# Patient Record
Sex: Female | Born: 1985 | Race: Black or African American | Hispanic: No | Marital: Single | Smoking: Former smoker
Health system: Southern US, Community
[De-identification: ages and names within clinical notes are randomized; demographics above are authoritative.]

## PROBLEM LIST (undated history)

## (undated) DIAGNOSIS — F32A Depression, unspecified: Secondary | ICD-10-CM

## (undated) DIAGNOSIS — E119 Type 2 diabetes mellitus without complications: Secondary | ICD-10-CM

## (undated) DIAGNOSIS — F329 Major depressive disorder, single episode, unspecified: Secondary | ICD-10-CM

## (undated) DIAGNOSIS — O24419 Gestational diabetes mellitus in pregnancy, unspecified control: Secondary | ICD-10-CM

## (undated) DIAGNOSIS — R87629 Unspecified abnormal cytological findings in specimens from vagina: Secondary | ICD-10-CM

## (undated) HISTORY — DX: Unspecified abnormal cytological findings in specimens from vagina: R87.629

## (undated) HISTORY — PX: DILATION AND CURETTAGE OF UTERUS: SHX78

## (undated) HISTORY — PX: NO PAST SURGERIES: SHX2092

## (undated) HISTORY — DX: Gestational diabetes mellitus in pregnancy, unspecified control: O24.419

## (undated) HISTORY — DX: Depression, unspecified: F32.A

---
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
## 2005-08-26 ENCOUNTER — Encounter: Payer: Self-pay | Admitting: Emergency Medicine

## 2006-01-14 ENCOUNTER — Ambulatory Visit (HOSPITAL_COMMUNITY): Admission: RE | Admit: 2006-01-14 | Discharge: 2006-01-14 | Payer: Self-pay | Admitting: Obstetrics & Gynecology

## 2006-01-28 ENCOUNTER — Ambulatory Visit (HOSPITAL_COMMUNITY): Admission: RE | Admit: 2006-01-28 | Discharge: 2006-01-28 | Payer: Self-pay | Admitting: Obstetrics & Gynecology

## 2006-03-17 ENCOUNTER — Ambulatory Visit: Payer: Self-pay | Admitting: Obstetrics and Gynecology

## 2006-03-31 ENCOUNTER — Ambulatory Visit: Payer: Self-pay | Admitting: Obstetrics and Gynecology

## 2006-06-19 ENCOUNTER — Ambulatory Visit: Payer: Self-pay | Admitting: Obstetrics and Gynecology

## 2006-06-19 ENCOUNTER — Inpatient Hospital Stay (HOSPITAL_COMMUNITY): Admission: AD | Admit: 2006-06-19 | Discharge: 2006-06-21 | Payer: Self-pay | Admitting: Obstetrics & Gynecology

## 2006-12-16 ENCOUNTER — Ambulatory Visit: Payer: Self-pay | Admitting: Gynecology

## 2006-12-30 ENCOUNTER — Ambulatory Visit: Payer: Self-pay | Admitting: Family Medicine

## 2006-12-30 ENCOUNTER — Other Ambulatory Visit: Admission: RE | Admit: 2006-12-30 | Discharge: 2006-12-30 | Payer: Self-pay | Admitting: Obstetrics and Gynecology

## 2006-12-30 ENCOUNTER — Encounter: Payer: Self-pay | Admitting: Obstetrics and Gynecology

## 2007-01-27 ENCOUNTER — Ambulatory Visit: Payer: Self-pay | Admitting: Obstetrics and Gynecology

## 2010-09-09 NOTE — Assessment & Plan Note (Signed)
NAME:  Alexis Ali, Alexis Ali                  ACCOUNT NO.:  [id number]   MEDICAL RECORD NO.:  [id number]          PATIENT TYPE:  WOC   LOCATION:  [facility] at [facility]         FACILITY:  [facility]   PHYSICIAN:  Tinnie Gens, MD        DATE OF BIRTH:  1985/11/03   DATE OF SERVICE:                                  CLINIC NOTE   CHIEF COMPLAINT:  Abnormal Pap smear.   HISTORY OF PRESENT ILLNESS:  Patient is a 25 year old gravida 1, para 1  who had a vaginal delivery, who has had abnormal Paps.  Her last Pap was  CIN-II to III and her pathology shows CIN-II to III on colpo.  She was  referred from [facility] for a LEEP procedure.  Patient has already  been seen and consulted by Dr. Mia Creek and watched the video.  She is  here today for the actual procedure.   PHYSICAL EXAMINATION:  VITAL SIGNS:  On exam, her vitals are as noted in  the chart.  GENERAL:  She is a well-developed, well-nourished female in no acute  distress.  GU:  Normal external female genitalia.  BUS is normal.  Vaginal is pink  and rugose.  Cervix is parous without lesion, it is small.   PROCEDURE:  __________  was placed on the cervix.  An area of  acetowhite is noted to extend up into the endocervical canal between 12  and 2 o'clock.  The cervix was then infiltrated with 20 mL of 1%  lidocaine with epinephrine.  LEEP is done with a medium-sized apple  fissure cone.  The bed of the LEEP was then cauterized with  electrocautery.  Monsel's was used to achieve hemostasis.  Patient  tolerated the procedure well.  All instruments were removed from the  vagina.   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   PLAN:  Followup path in four weeks.  Nothing in the vagina for at least  the next two weeks.           ______________________________  Tinnie Gens, MD     TP/MEDQ  D:  12/30/2006  T:  12/30/2006  Job:  [id number]

## 2011-02-06 LAB — POCT PREGNANCY, URINE
Operator id: 120861
Preg Test, Ur: NEGATIVE

## 2018-08-22 ENCOUNTER — Encounter (HOSPITAL_COMMUNITY): Payer: Self-pay

## 2018-08-22 ENCOUNTER — Emergency Department (HOSPITAL_COMMUNITY)
Admission: EM | Admit: 2018-08-22 | Discharge: 2018-08-22 | Disposition: A | Discharge status: Home / Self Care | Payer: Medicaid Other | Attending: Emergency Medicine | Admitting: Emergency Medicine

## 2018-08-22 DIAGNOSIS — Z3201 Encounter for pregnancy test, result positive: Secondary | ICD-10-CM | POA: Diagnosis not present

## 2018-08-22 DIAGNOSIS — R110 Nausea: Secondary | ICD-10-CM | POA: Diagnosis not present

## 2018-08-22 LAB — POC URINE PREG, ED: Preg Test, Ur: POSITIVE — AB

## 2018-08-22 NOTE — ED Provider Notes (Signed)
[facility]   CSN: [id number] Arrival date & time: 08/22/18  2100    History   Chief Complaint Chief Complaint  Patient presents with  . Possible Pregnancy    HPI Alexis Ali is a 33 y.o. female.     Patient to ED to find out if she is pregnant. LMP 07/18/2018. No vaginal bleeding, abdominal/pelvic pain, vaginal discharge, vomiting, fever, or urinary symptoms. She took a home pregnancy test that was positive. No other complaints.  The history is provided by the patient. No language interpreter was used.  Possible Pregnancy  Pertinent negatives include no abdominal pain.    History reviewed. No pertinent past medical history.  There are no active problems to display for this patient.   OB History   No obstetric history on file.      Home Medications    Prior to Admission medications   Not on File    Family History History reviewed. No pertinent family history.  Social History Social History   Tobacco Use  . Smoking status: Not on file  Substance Use Topics  . Alcohol use: Not on file  . Drug use: Not on file     Allergies   Patient has no known allergies.   Review of Systems Review of Systems  Gastrointestinal: Positive for nausea. Negative for abdominal pain and vomiting.  Genitourinary: Positive for menstrual problem. Negative for dysuria, pelvic pain, vaginal bleeding and vaginal discharge.  Musculoskeletal: Negative for back pain.  Neurological: Negative for light-headedness.     Physical Exam Updated Vital Signs BP 122/81   Pulse 64   Temp 98.3 F (36.8 C) (Oral)   Resp 18   Ht 5\' 1"  (1.549 m)   Wt 90.7 kg   LMP 07/18/2018   SpO2 100%   BMI 37.79 kg/m   Physical Exam Constitutional:      Appearance: She is well-developed.  Neck:     Musculoskeletal: Normal range of motion.  Pulmonary:     Effort: Pulmonary effort is normal.  Abdominal:     Palpations: Abdomen is soft.      Tenderness: There is no abdominal tenderness.  Skin:    General: Skin is warm and dry.  Neurological:     Mental Status: She is alert and oriented to person, place, and time.      ED Treatments / Results  Labs (all labs ordered are listed, but only abnormal results are displayed) Labs Reviewed  POC URINE PREG, ED - Abnormal; Notable for the following components:      Result Value   Preg Test, Ur POSITIVE (*)    All other components within normal limits    EKG None  Radiology No results found.  Procedures Procedures (including critical care time)  Medications Ordered in ED Medications - No data to display   Initial Impression / Assessment and Plan / ED Course  I have reviewed the triage vital signs and the nursing notes.  Pertinent labs & imaging results that were available during my care of the patient were reviewed by me and considered in my medical decision making (see chart for details).        Patient to ED to confirm pregnancy. Urine pregnancy test positive. No bleeding or pain. Will refer for prenatal care, start prenatal vitamins.  Final Clinical Impressions(s) / ED Diagnoses   Final diagnoses:  None   1. Pregnancy  ED Discharge Orders    None  Elpidio Anis, PA-C 08/22/18 2215    Gerhard Munch, MD 08/23/18 [phone]

## 2018-08-22 NOTE — ED Triage Notes (Signed)
Pt reports last period last month. Pt reports taking at home test today that was positive. Pt reports some nausea this AM.

## 2018-08-22 NOTE — Discharge Instructions (Addendum)
You should start taking prenatal vitamins which can be found over-the-counter. Take one daily. Make an appointment with an OB of your choice to start prenatal care.

## 2018-09-30 DIAGNOSIS — O0990 Supervision of high risk pregnancy, unspecified, unspecified trimester: Secondary | ICD-10-CM | POA: Insufficient documentation

## 2018-10-03 ENCOUNTER — Other Ambulatory Visit (HOSPITAL_COMMUNITY)
Admission: RE | Admit: 2018-10-03 | Discharge: 2018-10-03 | Disposition: A | Discharge status: Home / Self Care | Payer: Medicaid Other | Source: Ambulatory Visit | Attending: Obstetrics and Gynecology | Admitting: Obstetrics and Gynecology

## 2018-10-03 ENCOUNTER — Encounter: Payer: Self-pay | Admitting: Obstetrics and Gynecology

## 2018-10-03 ENCOUNTER — Other Ambulatory Visit: Payer: Self-pay

## 2018-10-03 ENCOUNTER — Ambulatory Visit (INDEPENDENT_AMBULATORY_CARE_PROVIDER_SITE_OTHER): Payer: Medicaid Other | Admitting: Obstetrics and Gynecology

## 2018-10-03 DIAGNOSIS — Z3491 Encounter for supervision of normal pregnancy, unspecified, first trimester: Secondary | ICD-10-CM

## 2018-10-03 DIAGNOSIS — Z8632 Personal history of gestational diabetes: Secondary | ICD-10-CM

## 2018-10-03 DIAGNOSIS — Z3490 Encounter for supervision of normal pregnancy, unspecified, unspecified trimester: Secondary | ICD-10-CM | POA: Insufficient documentation

## 2018-10-03 DIAGNOSIS — Z3481 Encounter for supervision of other normal pregnancy, first trimester: Secondary | ICD-10-CM | POA: Diagnosis not present

## 2018-10-03 DIAGNOSIS — O24119 Pre-existing diabetes mellitus, type 2, in pregnancy, unspecified trimester: Secondary | ICD-10-CM | POA: Insufficient documentation

## 2018-10-03 DIAGNOSIS — Z3A11 11 weeks gestation of pregnancy: Secondary | ICD-10-CM

## 2018-10-03 HISTORY — DX: Personal history of gestational diabetes: Z86.32

## 2018-10-03 MED ORDER — DOXYLAMINE-PYRIDOXINE 10-10 MG PO TBEC: DELAYED_RELEASE_TABLET | ORAL | 6 refills | Status: DC

## 2018-10-03 MED ORDER — BLOOD PRESSURE MONITOR KIT: 1.0000 | PACK | Freq: Once | 0 refills | Status: DC

## 2018-10-03 MED ORDER — VITAFOL GUMMIES 3.33-0.333-34.8 MG PO CHEW: 3.0000 | CHEWABLE_TABLET | Freq: Every day | ORAL | 11 refills | Status: DC

## 2018-10-03 NOTE — Progress Notes (Signed)
  Subjective:    Alexis Ali is a G2P1001 [redacted]w[redacted]d being seen today for her first obstetrical visit.  Her obstetrical history is significant for obesity and history pf GDMA1. Patient does intend to breast feed. Pregnancy history fully reviewed.  Patient reports vomiting.  Vitals:   10/03/18 1012  BP: 112/73  Pulse: 80  Weight: 234 lb (106.1 kg)    HISTORY: OB History  Gravida Para Term Preterm AB Living  2 1 1     1   SAB TAB Ectopic Multiple Live Births          1    # Outcome Date GA Lbr Len/2nd Weight Sex Delivery Anes PTL Lv  2 Current           1 Term 06/19/06 [redacted]w[redacted]d   M Vag-Spont   LIV   No past medical history on file.  No family history on file.   Exam    Uterus:   12-weeks  Pelvic Exam:    Perineum: No Hemorrhoids, Normal Perineum   Vulva: normal   Vagina:  normal mucosa, normal discharge   pH:    Cervix: multiparous appearance   Adnexa: normal adnexa and no mass, fullness, tenderness   Bony Pelvis: gynecoid  System: Breast:  normal appearance, no masses or tenderness   Skin: normal coloration and turgor, no rashes    Neurologic: oriented, no focal deficits   Extremities: normal strength, tone, and muscle mass   HEENT extra ocular movement intact   Mouth/Teeth mucous membranes moist, pharynx normal without lesions and dental hygiene good   Neck supple and no masses   Cardiovascular: regular rate and rhythm   Respiratory:  appears well, vitals normal, no respiratory distress, acyanotic, normal RR, chest clear, no wheezing, crepitations, rhonchi, normal symmetric air entry   Abdomen: soft, non-tender; bowel sounds normal; no masses,  no organomegaly   Urinary:       Assessment:    Pregnancy: G2P1001 Patient Active Problem List   Diagnosis Date Noted  . Supervision of normal pregnancy, antepartum 09/30/2018        Plan:     Initial labs drawn. Prenatal vitamins. Problem list reviewed and updated. Genetic Screening discussed : panorama  ordered.  Ultrasound discussed; fetal survey: requested.  Follow up in 4 weeks. 50% of 30 min visit spent on counseling and coordination of care.     Ricquel Foulk 10/03/2018

## 2018-10-03 NOTE — Patient Instructions (Signed)
° °First Trimester of Pregnancy °The first trimester of pregnancy is from week 1 until the end of week 13 (months 1 through 3). A week after a sperm fertilizes an egg, the egg will implant on the wall of the uterus. This embryo will begin to develop into a baby. Genes from you and your partner will form the baby. The female genes will determine whether the baby will be a boy or a girl. At 6-8 weeks, the eyes and face will be formed, and the heartbeat can be seen on ultrasound. At the end of 12 weeks, all the baby's organs will be formed. °Now that you are pregnant, you will want to do everything you can to have a healthy baby. Two of the most important things are to get good prenatal care and to follow your health care provider's instructions. Prenatal care is all the medical care you receive before the baby's birth. This care will help prevent, find, and treat any problems during the pregnancy and childbirth. °Body changes during your first trimester °Your body goes through many changes during pregnancy. The changes vary from woman to woman. °· You may gain or lose a couple of pounds at first. °· You may feel sick to your stomach (nauseous) and you may throw up (vomit). If the vomiting is uncontrollable, call your health care provider. °· You may tire easily. °· You may develop headaches that can be relieved by medicines. All medicines should be approved by your health care provider. °· You may urinate more often. Painful urination may mean you have a bladder infection. °· You may develop heartburn as a result of your pregnancy. °· You may develop constipation because certain hormones are causing the muscles that push stool through your intestines to slow down. °· You may develop hemorrhoids or swollen veins (varicose veins). °· Your breasts may begin to grow larger and become tender. Your nipples may stick out more, and the tissue that surrounds them (areola) may become darker. °· Your gums may bleed and may be  sensitive to brushing and flossing. °· Dark spots or blotches (chloasma, mask of pregnancy) may develop on your face. This will likely fade after the baby is born. °· Your menstrual periods will stop. °· You may have a loss of appetite. °· You may develop cravings for certain kinds of food. °· You may have changes in your emotions from day to day, such as being excited to be pregnant or being concerned that something may go wrong with the pregnancy and baby. °· You may have more vivid and strange dreams. °· You may have changes in your hair. These can include thickening of your hair, rapid growth, and changes in texture. Some women also have hair loss during or after pregnancy, or hair that feels dry or thin. Your hair will most likely return to normal after your baby is born. °What to expect at prenatal visits °During a routine prenatal visit: °· You will be weighed to make sure you and the baby are growing normally. °· Your blood pressure will be taken. °· Your abdomen will be measured to track your baby's growth. °· The fetal heartbeat will be listened to between weeks 10 and 14 of your pregnancy. °· Test results from any previous visits will be discussed. °Your health care provider may ask you: °· How you are feeling. °· If you are feeling the baby move. °· If you have had any abnormal symptoms, such as leaking fluid, bleeding, severe headaches, or   abdominal cramping. °· If you are using any tobacco products, including cigarettes, chewing tobacco, and electronic cigarettes. °· If you have any questions. °Other tests that may be performed during your first trimester include: °· Blood tests to find your blood type and to check for the presence of any previous infections. The tests will also be used to check for low iron levels (anemia) and protein on red blood cells (Rh antibodies). Depending on your risk factors, or if you previously had diabetes during pregnancy, you may have tests to check for high blood sugar  that affects pregnant women (gestational diabetes). °· Urine tests to check for infections, diabetes, or protein in the urine. °· An ultrasound to confirm the proper growth and development of the baby. °· Fetal screens for spinal cord problems (spina bifida) and Down syndrome. °· HIV (human immunodeficiency virus) testing. Routine prenatal testing includes screening for HIV, unless you choose not to have this test. °· You may need other tests to make sure you and the baby are doing well. °Follow these instructions at home: °Medicines °· Follow your health care provider's instructions regarding medicine use. Specific medicines may be either safe or unsafe to take during pregnancy. °· Take a prenatal vitamin that contains at least 600 micrograms (mcg) of folic acid. °· If you develop constipation, try taking a stool softener if your health care provider approves. °Eating and drinking ° °· Eat a balanced diet that includes fresh fruits and vegetables, whole grains, good sources of protein such as meat, eggs, or tofu, and low-fat dairy. Your health care provider will help you determine the amount of weight gain that is right for you. °· Avoid raw meat and uncooked cheese. These carry germs that can cause birth defects in the baby. °· Eating four or five small meals rather than three large meals a day may help relieve nausea and vomiting. If you start to feel nauseous, eating a few soda crackers can be helpful. Drinking liquids between meals, instead of during meals, also seems to help ease nausea and vomiting. °· Limit foods that are high in fat and processed sugars, such as fried and sweet foods. °· To prevent constipation: °? Eat foods that are high in fiber, such as fresh fruits and vegetables, whole grains, and beans. °? Drink enough fluid to keep your urine clear or pale yellow. °Activity °· Exercise only as directed by your health care provider. Most women can continue their usual exercise routine during  pregnancy. Try to exercise for 30 minutes at least 5 days a week. Exercising will help you: °? Control your weight. °? Stay in shape. °? Be prepared for labor and delivery. °· Experiencing pain or cramping in the lower abdomen or lower back is a good sign that you should stop exercising. Check with your health care provider before continuing with normal exercises. °· Try to avoid standing for long periods of time. Move your legs often if you must stand in one place for a long time. °· Avoid heavy lifting. °· Wear low-heeled shoes and practice good posture. °· You may continue to have sex unless your health care provider tells you not to. °Relieving pain and discomfort °· Wear a good support bra to relieve breast tenderness. °· Take warm sitz baths to soothe any pain or discomfort caused by hemorrhoids. Use hemorrhoid cream if your health care provider approves. °· Rest with your legs elevated if you have leg cramps or low back pain. °· If you develop varicose veins   in your legs, wear support hose. Elevate your feet for 15 minutes, 3-4 times a day. Limit salt in your diet. °Prenatal care °· Schedule your prenatal visits by the twelfth week of pregnancy. They are usually scheduled monthly at first, then more often in the last 2 months before delivery. °· Write down your questions. Take them to your prenatal visits. °· Keep all your prenatal visits as told by your health care provider. This is important. °Safety °· Wear your seat belt at all times when driving. °· Make a list of emergency phone numbers, including numbers for family, friends, the hospital, and police and fire departments. °General instructions °· Ask your health care provider for a referral to a local prenatal education class. Begin classes no later than the beginning of month 6 of your pregnancy. °· Ask for help if you have counseling or nutritional needs during pregnancy. Your health care provider can offer advice or refer you to specialists for help  with various needs. °· Do not use hot tubs, steam rooms, or saunas. °· Do not douche or use tampons or scented sanitary pads. °· Do not cross your legs for long periods of time. °· Avoid cat litter boxes and soil used by cats. These carry germs that can cause birth defects in the baby and possibly loss of the fetus by miscarriage or stillbirth. °· Avoid all smoking, herbs, alcohol, and medicines not prescribed by your health care provider. Chemicals in these products affect the formation and growth of the baby. °· Do not use any products that contain nicotine or tobacco, such as cigarettes and e-cigarettes. If you need help quitting, ask your health care provider. You may receive counseling support and other resources to help you quit. °· Schedule a dentist appointment. At home, brush your teeth with a soft toothbrush and be gentle when you floss. °Contact a health care provider if: °· You have dizziness. °· You have mild pelvic cramps, pelvic pressure, or nagging pain in the abdominal area. °· You have persistent nausea, vomiting, or diarrhea. °· You have a bad smelling vaginal discharge. °· You have pain when you urinate. °· You notice increased swelling in your face, hands, legs, or ankles. °· You are exposed to fifth disease or chickenpox. °· You are exposed to German measles (rubella) and have never had it. °Get help right away if: °· You have a fever. °· You are leaking fluid from your vagina. °· You have spotting or bleeding from your vagina. °· You have severe abdominal cramping or pain. °· You have rapid weight gain or loss. °· You vomit blood or material that looks like coffee grounds. °· You develop a severe headache. °· You have shortness of breath. °· You have any kind of trauma, such as from a fall or a car accident. °Summary °· The first trimester of pregnancy is from week 1 until the end of week 13 (months 1 through 3). °· Your body goes through many changes during pregnancy. The changes vary from  woman to woman. °· You will have routine prenatal visits. During those visits, your health care provider will examine you, discuss any test results you may have, and talk with you about how you are feeling. °This information is not intended to replace advice given to you by your health care provider. Make sure you discuss any questions you have with your health care provider. °Document Released: 04/07/2001 Document Revised: 03/25/2016 Document Reviewed: 03/25/2016 °[employer] Interactive Patient Education © 2019 [employer] ° ° °  Second Trimester of Pregnancy °The second trimester is from week 14 through week 27 (months 4 through 6). The second trimester is often a time when you feel your best. Your body has adjusted to being pregnant, and you begin to feel better physically. Usually, morning sickness has lessened or quit completely, you may have more energy, and you may have an increase in appetite. The second trimester is also a time when the fetus is growing rapidly. At the end of the sixth month, the fetus is about 9 inches long and weighs about 1½ pounds. You will likely begin to feel the baby move (quickening) between 16 and 20 weeks of pregnancy. °Body changes during your second trimester °Your body continues to go through many changes during your second trimester. The changes vary from woman to woman. °· Your weight will continue to increase. You will notice your lower abdomen bulging out. °· You may begin to get stretch marks on your hips, abdomen, and breasts. °· You may develop headaches that can be relieved by medicines. The medicines should be approved by your health care provider. °· You may urinate more often because the fetus is pressing on your bladder. °· You may develop or continue to have heartburn as a result of your pregnancy. °· You may develop constipation because certain hormones are causing the muscles that push waste through your intestines to slow down. °· You may develop hemorrhoids or  swollen, bulging veins (varicose veins). °· You may have back pain. This is caused by: °? Weight gain. °? Pregnancy hormones that are relaxing the joints in your pelvis. °? A shift in weight and the muscles that support your balance. °· Your breasts will continue to grow and they will continue to become tender. °· Your gums may bleed and may be sensitive to brushing and flossing. °· Dark spots or blotches (chloasma, mask of pregnancy) may develop on your face. This will likely fade after the baby is born. °· A dark line from your belly button to the pubic area (linea nigra) may appear. This will likely fade after the baby is born. °· You may have changes in your hair. These can include thickening of your hair, rapid growth, and changes in texture. Some women also have hair loss during or after pregnancy, or hair that feels dry or thin. Your hair will most likely return to normal after your baby is born. °What to expect at prenatal visits °During a routine prenatal visit: °· You will be weighed to make sure you and the fetus are growing normally. °· Your blood pressure will be taken. °· Your abdomen will be measured to track your baby's growth. °· The fetal heartbeat will be listened to. °· Any test results from the previous visit will be discussed. °Your health care provider may ask you: °· How you are feeling. °· If you are feeling the baby move. °· If you have had any abnormal symptoms, such as leaking fluid, bleeding, severe headaches, or abdominal cramping. °· If you are using any tobacco products, including cigarettes, chewing tobacco, and electronic cigarettes. °· If you have any questions. °Other tests that may be performed during your second trimester include: °· Blood tests that check for: °? Low iron levels (anemia). °? High blood sugar that affects pregnant women (gestational diabetes) between 24 and 28 weeks. °? Rh antibodies. This is to check for a protein on red blood cells (Rh factor). °· Urine tests  to check for infections, diabetes, or protein in   the urine. °· An ultrasound to confirm the proper growth and development of the baby. °· An amniocentesis to check for possible genetic problems. °· Fetal screens for spina bifida and Down syndrome. °· HIV (human immunodeficiency virus) testing. Routine prenatal testing includes screening for HIV, unless you choose not to have this test. °Follow these instructions at home: °Medicines °· Follow your health care provider's instructions regarding medicine use. Specific medicines may be either safe or unsafe to take during pregnancy. °· Take a prenatal vitamin that contains at least 600 micrograms (mcg) of folic acid. °· If you develop constipation, try taking a stool softener if your health care provider approves. °Eating and drinking ° °· Eat a balanced diet that includes fresh fruits and vegetables, whole grains, good sources of protein such as meat, eggs, or tofu, and low-fat dairy. Your health care provider will help you determine the amount of weight gain that is right for you. °· Avoid raw meat and uncooked cheese. These carry germs that can cause birth defects in the baby. °· If you have low calcium intake from food, talk to your health care provider about whether you should take a daily calcium supplement. °· Limit foods that are high in fat and processed sugars, such as fried and sweet foods. °· To prevent constipation: °? Drink enough fluid to keep your urine clear or pale yellow. °? Eat foods that are high in fiber, such as fresh fruits and vegetables, whole grains, and beans. °Activity °· Exercise only as directed by your health care provider. Most women can continue their usual exercise routine during pregnancy. Try to exercise for 30 minutes at least 5 days a week. Stop exercising if you experience uterine contractions. °· Avoid heavy lifting, wear low heel shoes, and practice good posture. °· A sexual relationship may be continued unless your health care  provider directs you otherwise. °Relieving pain and discomfort °· Wear a good support bra to prevent discomfort from breast tenderness. °· Take warm sitz baths to soothe any pain or discomfort caused by hemorrhoids. Use hemorrhoid cream if your health care provider approves. °· Rest with your legs elevated if you have leg cramps or low back pain. °· If you develop varicose veins, wear support hose. Elevate your feet for 15 minutes, 3-4 times a day. Limit salt in your diet. °Prenatal Care °· Write down your questions. Take them to your prenatal visits. °· Keep all your prenatal visits as told by your health care provider. This is important. °Safety °· Wear your seat belt at all times when driving. °· Make a list of emergency phone numbers, including numbers for family, friends, the hospital, and police and fire departments. °General instructions °· Ask your health care provider for a referral to a local prenatal education class. Begin classes no later than the beginning of month 6 of your pregnancy. °· Ask for help if you have counseling or nutritional needs during pregnancy. Your health care provider can offer advice or refer you to specialists for help with various needs. °· Do not use hot tubs, steam rooms, or saunas. °· Do not douche or use tampons or scented sanitary pads. °· Do not cross your legs for long periods of time. °· Avoid cat litter boxes and soil used by cats. These carry germs that can cause birth defects in the baby and possibly loss of the fetus by miscarriage or stillbirth. °· Avoid all smoking, herbs, alcohol, and unprescribed drugs. Chemicals in these products can affect the formation   and growth of the baby. °· Do not use any products that contain nicotine or tobacco, such as cigarettes and e-cigarettes. If you need help quitting, ask your health care provider. °· Visit your dentist if you have not gone yet during your pregnancy. Use a soft toothbrush to brush your teeth and be gentle when you  floss. °Contact a health care provider if: °· You have dizziness. °· You have mild pelvic cramps, pelvic pressure, or nagging pain in the abdominal area. °· You have persistent nausea, vomiting, or diarrhea. °· You have a bad smelling vaginal discharge. °· You have pain when you urinate. °Get help right away if: °· You have a fever. °· You are leaking fluid from your vagina. °· You have spotting or bleeding from your vagina. °· You have severe abdominal cramping or pain. °· You have rapid weight gain or weight loss. °· You have shortness of breath with chest pain. °· You notice sudden or extreme swelling of your face, hands, ankles, feet, or legs. °· You have not felt your baby move in over an hour. °· You have severe headaches that do not go away when you take medicine. °· You have vision changes. °Summary °· The second trimester is from week 14 through week 27 (months 4 through 6). It is also a time when the fetus is growing rapidly. °· Your body goes through many changes during pregnancy. The changes vary from woman to woman. °· Avoid all smoking, herbs, alcohol, and unprescribed drugs. These chemicals affect the formation and growth your baby. °· Do not use any tobacco products, such as cigarettes, chewing tobacco, and e-cigarettes. If you need help quitting, ask your health care provider. °· Contact your health care provider if you have any questions. Keep all prenatal visits as told by your health care provider. This is important. °This information is not intended to replace advice given to you by your health care provider. Make sure you discuss any questions you have with your health care provider. °Document Released: 04/07/2001 Document Revised: 05/19/2016 Document Reviewed: 05/19/2016 °[employer] Interactive Patient Education © 2019 [employer] ° ° °Contraception Choices °Contraception, also called birth control, refers to methods or devices that prevent pregnancy. °Hormonal methods °Contraceptive  implant ° °A contraceptive implant is a thin, plastic tube that contains a hormone. It is inserted into the upper part of the arm. It can remain in place for up to 3 years. °Progestin-only injections °Progestin-only injections are injections of progestin, a synthetic form of the hormone progesterone. They are given every 3 months by a health care provider. °Birth control pills ° °Birth control pills are pills that contain hormones that prevent pregnancy. They must be taken once a day, preferably at the same time each day. °Birth control patch ° °The birth control patch contains hormones that prevent pregnancy. It is placed on the skin and must be changed once a week for three weeks and removed on the fourth week. A prescription is needed to use this method of contraception. °Vaginal ring ° °A vaginal ring contains hormones that prevent pregnancy. It is placed in the vagina for three weeks and removed on the fourth week. After that, the process is repeated with a new ring. A prescription is needed to use this method of contraception. °Emergency contraceptive °Emergency contraceptives prevent pregnancy after unprotected sex. They come in pill form and can be taken up to 5 days after sex. They work best the sooner they are taken after having sex. Most emergency   contraceptives are available without a prescription. This method should not be used as your only form of birth control. °Barrier methods °Female condom ° °A female condom is a thin sheath that is worn over the penis during sex. Condoms keep sperm from going inside a woman's body. They can be used with a spermicide to increase their effectiveness. They should be disposed after a single use. °Female condom ° °A female condom is a soft, loose-fitting sheath that is put into the vagina before sex. The condom keeps sperm from going inside a woman's body. They should be disposed after a single use. °Diaphragm ° °A diaphragm is a soft, dome-shaped barrier. It is inserted  into the vagina before sex, along with a spermicide. The diaphragm blocks sperm from entering the uterus, and the spermicide kills sperm. A diaphragm should be left in the vagina for 6-8 hours after sex and removed within 24 hours. °A diaphragm is prescribed and fitted by a health care provider. A diaphragm should be replaced every 1-2 years, after giving birth, after gaining more than 15 lb (6.8 kg), and after pelvic surgery. °Cervical cap ° °A cervical cap is a round, soft latex or plastic cup that fits over the cervix. It is inserted into the vagina before sex, along with spermicide. It blocks sperm from entering the uterus. The cap should be left in place for 6-8 hours after sex and removed within 48 hours. A cervical cap must be prescribed and fitted by a health care provider. It should be replaced every 2 years. °Sponge ° °A sponge is a soft, circular piece of polyurethane foam with spermicide on it. The sponge helps block sperm from entering the uterus, and the spermicide kills sperm. To use it, you make it wet and then insert it into the vagina. It should be inserted before sex, left in for at least 6 hours after sex, and removed and thrown away within 30 hours. °Spermicides °Spermicides are chemicals that kill or block sperm from entering the cervix and uterus. They can come as a cream, jelly, suppository, foam, or tablet. A spermicide should be inserted into the vagina with an applicator at least 10-15 minutes before sex to allow time for it to work. The process must be repeated every time you have sex. Spermicides do not require a prescription. °Intrauterine contraception °Intrauterine device (IUD) °An IUD is a T-shaped device that is put in a woman's uterus. There are two types: °· Hormone IUD.This type contains progestin, a synthetic form of the hormone progesterone. This type can stay in place for 3-5 years. °· Copper IUD.This type is wrapped in copper wire. It can stay in place for 10  years. ° °Permanent methods of contraception °Female tubal ligation °In this method, a woman's fallopian tubes are sealed, tied, or blocked during surgery to prevent eggs from traveling to the uterus. °Hysteroscopic sterilization °In this method, a small, flexible insert is placed into each fallopian tube. The inserts cause scar tissue to form in the fallopian tubes and block them, so sperm cannot reach an egg. The procedure takes about 3 months to be effective. Another form of birth control must be used during those 3 months. °Female sterilization °This is a procedure to tie off the tubes that carry sperm (vasectomy). After the procedure, the man can still ejaculate fluid (semen). °Natural planning methods °Natural family planning °In this method, a couple does not have sex on days when the woman could become pregnant. °Calendar method °This means keeping track   of the length of each menstrual cycle, identifying the days when pregnancy can happen, and not having sex on those days. °Ovulation method °In this method, a couple avoids sex during ovulation. °Symptothermal method °This method involves not having sex during ovulation. The woman typically checks for ovulation by watching changes in her temperature and in the consistency of cervical mucus. °Post-ovulation method °In this method, a couple waits to have sex until after ovulation. °Summary °· Contraception, also called birth control, means methods or devices that prevent pregnancy. °· Hormonal methods of contraception include implants, injections, pills, patches, vaginal rings, and emergency contraceptives. °· Barrier methods of contraception can include female condoms, female condoms, diaphragms, cervical caps, sponges, and spermicides. °· There are two types of IUDs (intrauterine devices). An IUD can be put in a woman's uterus to prevent pregnancy for 3-5 years. °· Permanent sterilization can be done through a procedure for males, females, or both. °· Natural  family planning methods involve not having sex on days when the woman could become pregnant. °This information is not intended to replace advice given to you by your health care provider. Make sure you discuss any questions you have with your health care provider. °Document Released: 04/13/2005 Document Revised: 04/15/2017 Document Reviewed: 05/16/2016 °[employer]vier Interactive Patient Education © 2019 [employer] ° ° °Breastfeeding ° °Choosing to breastfeed is one of the best decisions you can make for yourself and your baby. A change in hormones during pregnancy causes your breasts to make breast milk in your milk-producing glands. Hormones prevent breast milk from being released before your baby is born. They also prompt milk flow after birth. Once breastfeeding has begun, thoughts of your baby, as well as his or her sucking or crying, can stimulate the release of milk from your milk-producing glands. °Benefits of breastfeeding °Research shows that breastfeeding offers many health benefits for infants and mothers. It also offers a cost-free and convenient way to feed your baby. °For your baby °· Your first milk (colostrum) helps your baby's digestive system to function better. °· Special cells in your milk (antibodies) help your baby to fight off infections. °· Breastfed babies are less likely to develop asthma, allergies, obesity, or type 2 diabetes. They are also at lower risk for sudden infant death syndrome (SIDS). °· Nutrients in breast milk are better able to meet your baby’s needs compared to infant formula. °· Breast milk improves your baby's brain development. °For you °· Breastfeeding helps to create a very special bond between you and your baby. °· Breastfeeding is convenient. Breast milk costs nothing and is always available at the correct temperature. °· Breastfeeding helps to burn calories. It helps you to lose the weight that you gained during pregnancy. °· Breastfeeding makes your uterus return  faster to its size before pregnancy. It also slows bleeding (lochia) after you give birth. °· Breastfeeding helps to lower your risk of developing type 2 diabetes, osteoporosis, rheumatoid arthritis, cardiovascular disease, and breast, ovarian, uterine, and endometrial cancer later in life. °Breastfeeding basics °Starting breastfeeding °· Find a comfortable place to sit or lie down, with your neck and back well-supported. °· Place a pillow or a rolled-up blanket under your baby to bring him or her to the level of your breast (if you are seated). Nursing pillows are specially designed to help support your arms and your baby while you breastfeed. °· Make sure that your baby's tummy (abdomen) is facing your abdomen. °· Gently massage your breast. With your fingertips, massage from the   outer edges of your breast inward toward the nipple. This encourages milk flow. If your milk flows slowly, you may need to continue this action during the feeding. °· Support your breast with 4 fingers underneath and your thumb above your nipple (make the letter "C" with your hand). Make sure your fingers are well away from your nipple and your baby’s mouth. °· Stroke your baby's lips gently with your finger or nipple. °· When your baby's mouth is open wide enough, quickly bring your baby to your breast, placing your entire nipple and as much of the areola as possible into your baby's mouth. The areola is the colored area around your nipple. °? More areola should be visible above your baby's upper lip than below the lower lip. °? Your baby's lips should be opened and extended outward (flanged) to ensure an adequate, comfortable latch. °? Your baby's tongue should be between his or her lower gum and your breast. °· Make sure that your baby's mouth is correctly positioned around your nipple (latched). Your baby's lips should create a seal on your breast and be turned out (everted). °· It is common for your baby to suck about 2-3 minutes in  order to start the flow of breast milk. °Latching °Teaching your baby how to latch onto your breast properly is very important. An improper latch can cause nipple pain, decreased milk supply, and poor weight gain in your baby. Also, if your baby is not latched onto your nipple properly, he or she may swallow some air during feeding. This can make your baby fussy. Burping your baby when you switch breasts during the feeding can help to get rid of the air. However, teaching your baby to latch on properly is still the best way to prevent fussiness from swallowing air while breastfeeding. °Signs that your baby has successfully latched onto your nipple °· Silent tugging or silent sucking, without causing you pain. Infant's lips should be extended outward (flanged). °· Swallowing heard between every 3-4 sucks once your milk has started to flow (after your let-down milk reflex occurs). °· Muscle movement above and in front of his or her ears while sucking. °Signs that your baby has not successfully latched onto your nipple °· Sucking sounds or smacking sounds from your baby while breastfeeding. °· Nipple pain. °If you think your baby has not latched on correctly, slip your finger into the corner of your baby’s mouth to break the suction and place it between your baby's gums. Attempt to start breastfeeding again. °Signs of successful breastfeeding °Signs from your baby °· Your baby will gradually decrease the number of sucks or will completely stop sucking. °· Your baby will fall asleep. °· Your baby's body will relax. °· Your baby will retain a small amount of milk in his or her mouth. °· Your baby will let go of your breast by himself or herself. °Signs from you °· Breasts that have increased in firmness, weight, and size 1-3 hours after feeding. °· Breasts that are softer immediately after breastfeeding. °· Increased milk volume, as well as a change in milk consistency and color by the fifth day of  breastfeeding. °· Nipples that are not sore, cracked, or bleeding. °Signs that your baby is getting enough milk °· Wetting at least 1-2 diapers during the first 24 hours after birth. °· Wetting at least 5-6 diapers every 24 hours for the first week after birth. The urine should be clear or pale yellow by the age of 5 days. °·   Wetting 6-8 diapers every 24 hours as your baby continues to grow and develop. °· At least 3 stools in a 24-hour period by the age of 5 days. The stool should be soft and yellow. °· At least 3 stools in a 24-hour period by the age of 7 days. The stool should be seedy and yellow. °· No loss of weight greater than 10% of birth weight during the first 3 days of life. °· Average weight gain of 4-7 oz (113-198 g) per week after the age of 4 days. °· Consistent daily weight gain by the age of 5 days, without weight loss after the age of 2 weeks. °After a feeding, your baby may spit up a small amount of milk. This is normal. °Breastfeeding frequency and duration °Frequent feeding will help you make more milk and can prevent sore nipples and extremely full breasts (breast engorgement). Breastfeed when you feel the need to reduce the fullness of your breasts or when your baby shows signs of hunger. This is called "breastfeeding on demand." Signs that your baby is hungry include: °· Increased alertness, activity, or restlessness. °· Movement of the head from side to side. °· Opening of the mouth when the corner of the mouth or cheek is stroked (rooting). °· Increased sucking sounds, smacking lips, cooing, sighing, or squeaking. °· Hand-to-mouth movements and sucking on fingers or hands. °· Fussing or crying. °Avoid introducing a pacifier to your baby in the first 4-6 weeks after your baby is born. After this time, you may choose to use a pacifier. Research has shown that pacifier use during the first year of a baby's life decreases the risk of sudden infant death syndrome (SIDS). °Allow your baby to feed  on each breast as long as he or she wants. When your baby unlatches or falls asleep while feeding from the first breast, offer the second breast. Because newborns are often sleepy in the first few weeks of life, you may need to awaken your baby to get him or her to feed. °Breastfeeding times will vary from baby to baby. However, the following rules can serve as a guide to help you make sure that your baby is properly fed: °· Newborns (babies 4 weeks of age or younger) may breastfeed every 1-3 hours. °· Newborns should not go without breastfeeding for longer than 3 hours during the day or 5 hours during the night. °· You should breastfeed your baby a minimum of 8 times in a 24-hour period. °Breast milk pumping ° °  ° °Pumping and storing breast milk allows you to make sure that your baby is exclusively fed your breast milk, even at times when you are unable to breastfeed. This is especially important if you go back to work while you are still breastfeeding, or if you are not able to be present during feedings. Your lactation consultant can help you find a method of pumping that works best for you and give you guidelines about how long it is safe to store breast milk. °Caring for your breasts while you breastfeed °Nipples can become dry, cracked, and sore while breastfeeding. The following recommendations can help keep your breasts moisturized and healthy: °· Avoid using soap on your nipples. °· Wear a supportive bra designed especially for nursing. Avoid wearing underwire-style bras or extremely tight bras (sports bras). °· Air-dry your nipples for 3-4 minutes after each feeding. °· Use only cotton bra pads to absorb leaked breast milk. Leaking of breast milk between feedings is normal. °·   Use lanolin on your nipples after breastfeeding. Lanolin helps to maintain your skin's normal moisture barrier. Pure lanolin is not harmful (not toxic) to your baby. You may also hand express a few drops of breast milk and gently  massage that milk into your nipples and allow the milk to air-dry. °In the first few weeks after giving birth, some women experience breast engorgement. Engorgement can make your breasts feel heavy, warm, and tender to the touch. Engorgement peaks within 3-5 days after you give birth. The following recommendations can help to ease engorgement: °· Completely empty your breasts while breastfeeding or pumping. You may want to start by applying warm, moist heat (in the shower or with warm, water-soaked hand towels) just before feeding or pumping. This increases circulation and helps the milk flow. If your baby does not completely empty your breasts while breastfeeding, pump any extra milk after he or she is finished. °· Apply ice packs to your breasts immediately after breastfeeding or pumping, unless this is too uncomfortable for you. To do this: °? Put ice in a plastic bag. °? Place a towel between your skin and the bag. °? Leave the ice on for 20 minutes, 2-3 times a day. °· Make sure that your baby is latched on and positioned properly while breastfeeding. °If engorgement persists after 48 hours of following these recommendations, contact your health care provider or a lactation consultant. °Overall health care recommendations while breastfeeding °· Eat 3 healthy meals and 3 snacks every day. Well-nourished mothers who are breastfeeding need an additional 450-500 calories a day. You can meet this requirement by increasing the amount of a balanced diet that you eat. °· Drink enough water to keep your urine pale yellow or clear. °· Rest often, relax, and continue to take your prenatal vitamins to prevent fatigue, stress, and low vitamin and mineral levels in your body (nutrient deficiencies). °· Do not use any products that contain nicotine or tobacco, such as cigarettes and e-cigarettes. Your baby may be harmed by chemicals from cigarettes that pass into breast milk and exposure to secondhand smoke. If you need help  quitting, ask your health care provider. °· Avoid alcohol. °· Do not use illegal drugs or marijuana. °· Talk with your health care provider before taking any medicines. These include over-the-counter and prescription medicines as well as vitamins and herbal supplements. Some medicines that may be harmful to your baby can pass through breast milk. °· It is possible to become pregnant while breastfeeding. If birth control is desired, ask your health care provider about options that will be safe while breastfeeding your baby. °Where to find more information: °[employer]: www.llli.org °Contact a health care provider if: °· You feel like you want to stop breastfeeding or have become frustrated with breastfeeding. °· Your nipples are cracked or bleeding. °· Your breasts are red, tender, or warm. °· You have: °? Painful breasts or nipples. °? A swollen area on either breast. °? A fever or chills. °? Nausea or vomiting. °? Drainage other than breast milk from your nipples. °· Your breasts do not become full before feedings by the fifth day after you give birth. °· You feel sad and depressed. °· Your baby is: °? Too sleepy to eat well. °? Having trouble sleeping. °? More than 1 week old and wetting fewer than 6 diapers in a 24-hour period. °? Not gaining weight by 5 days of age. °· Your baby has fewer than 3 stools in a 24-hour period. °·   Your baby's skin or the white parts of his or her eyes become yellow. °Get help right away if: °· Your baby is overly tired (lethargic) and does not want to wake up and feed. °· Your baby develops an unexplained fever. °Summary °· Breastfeeding offers many health benefits for infant and mothers. °· Try to breastfeed your infant when he or she shows early signs of hunger. °· Gently tickle or stroke your baby's lips with your finger or nipple to allow the baby to open his or her mouth. Bring the baby to your breast. Make sure that much of the areola is in your baby's mouth.  Offer one side and burp the baby before you offer the other side. °· Talk with your health care provider or lactation consultant if you have questions or you face problems as you breastfeed. °This information is not intended to replace advice given to you by your health care provider. Make sure you discuss any questions you have with your health care provider. °Document Released: 04/13/2005 Document Revised: 05/15/2016 Document Reviewed: 05/15/2016 °[employer] Interactive Patient Education © 2019 [employer] ° ° °

## 2018-10-03 NOTE — Progress Notes (Signed)
Pt report N&V today.

## 2018-10-04 LAB — CERVICOVAGINAL ANCILLARY ONLY
Bacterial vaginitis: POSITIVE — AB
Candida vaginitis: NEGATIVE
Chlamydia: NEGATIVE
Neisseria Gonorrhea: NEGATIVE
Trichomonas: NEGATIVE

## 2018-10-04 LAB — CYTOLOGY - PAP
Diagnosis: NEGATIVE
HPV: NOT DETECTED

## 2018-10-04 MED ORDER — METRONIDAZOLE 500 MG PO TABS: 500.0000 mg | ORAL_TABLET | Freq: Two times a day (BID) | ORAL | 0 refills | Status: DC

## 2018-10-04 NOTE — Addendum Note (Signed)
Addended by: Mora Bellman on: 10/04/2018 02:59 PM   Modules accepted: Orders

## 2018-10-05 LAB — OBSTETRIC PANEL, INCLUDING HIV
Antibody Screen: NEGATIVE
Basophils Absolute: 0 10*3/uL (ref 0.0–0.2)
Basos: 0 %
EOS (ABSOLUTE): 0.1 10*3/uL (ref 0.0–0.4)
Eos: 1 %
HIV Screen 4th Generation wRfx: NONREACTIVE
Hematocrit: 34.5 % (ref 34.0–46.6)
Hemoglobin: 11.6 g/dL (ref 11.1–15.9)
Hepatitis B Surface Ag: NEGATIVE
Immature Grans (Abs): 0 10*3/uL (ref 0.0–0.1)
Immature Granulocytes: 0 %
Lymphocytes Absolute: 1.7 10*3/uL (ref 0.7–3.1)
Lymphs: 25 %
MCH: 25.6 pg — ABNORMAL LOW (ref 26.6–33.0)
MCHC: 33.6 g/dL (ref 31.5–35.7)
MCV: 76 fL — ABNORMAL LOW (ref 79–97)
Monocytes Absolute: 0.5 10*3/uL (ref 0.1–0.9)
Monocytes: 8 %
Neutrophils Absolute: 4.4 10*3/uL (ref 1.4–7.0)
Neutrophils: 66 %
Platelets: 295 10*3/uL (ref 150–450)
RBC: 4.53 x10E6/uL (ref 3.77–5.28)
RDW: 12.5 % (ref 11.7–15.4)
RPR Ser Ql: NONREACTIVE
Rh Factor: POSITIVE
Rubella Antibodies, IGG: 4.89 index (ref >0.99)
WBC: 6.7 10*3/uL (ref 3.4–10.8)

## 2018-10-05 LAB — URINE CULTURE, OB REFLEX

## 2018-10-05 LAB — HEMOGLOBIN A1C
Est. average glucose Bld gHb Est-mCnc: 117 mg/dL
Hgb A1c MFr Bld: 5.7 % — ABNORMAL HIGH (ref 4.8–5.6)

## 2018-10-05 LAB — CULTURE, OB URINE

## 2018-10-05 MED ORDER — CEPHALEXIN 500 MG PO CAPS: 500.0000 mg | ORAL_CAPSULE | Freq: Four times a day (QID) | ORAL | 2 refills | Status: DC

## 2018-10-05 NOTE — Addendum Note (Signed)
Addended by: Mora Bellman on: 10/05/2018 08:43 PM   Modules accepted: Orders

## 2018-10-06 ENCOUNTER — Telehealth: Payer: Self-pay

## 2018-10-06 NOTE — Telephone Encounter (Signed)
Attempted to contact about results and need for appt, no answer, left vm.

## 2018-10-10 ENCOUNTER — Encounter: Payer: Self-pay | Admitting: Obstetrics and Gynecology

## 2018-10-12 ENCOUNTER — Encounter: Payer: Self-pay | Admitting: Obstetrics and Gynecology

## 2018-10-18 ENCOUNTER — Encounter: Payer: Self-pay | Admitting: Obstetrics and Gynecology

## 2018-10-21 ENCOUNTER — Other Ambulatory Visit: Payer: Self-pay

## 2018-10-21 DIAGNOSIS — Z3490 Encounter for supervision of normal pregnancy, unspecified, unspecified trimester: Secondary | ICD-10-CM

## 2018-10-21 MED ORDER — BLOOD PRESSURE MONITOR KIT: 1.0000 | PACK | 0 refills | Status: DC

## 2018-10-21 NOTE — Progress Notes (Unsigned)
Blood

## 2018-10-25 ENCOUNTER — Other Ambulatory Visit: Payer: Medicaid Other

## 2018-10-25 ENCOUNTER — Other Ambulatory Visit: Payer: Self-pay

## 2018-10-25 DIAGNOSIS — Z136 Encounter for screening for cardiovascular disorders: Secondary | ICD-10-CM | POA: Diagnosis not present

## 2018-10-25 DIAGNOSIS — O24419 Gestational diabetes mellitus in pregnancy, unspecified control: Secondary | ICD-10-CM

## 2018-10-25 DIAGNOSIS — Z3482 Encounter for supervision of other normal pregnancy, second trimester: Secondary | ICD-10-CM | POA: Diagnosis not present

## 2018-10-25 DIAGNOSIS — Z3490 Encounter for supervision of normal pregnancy, unspecified, unspecified trimester: Secondary | ICD-10-CM | POA: Diagnosis not present

## 2018-10-25 DIAGNOSIS — Z3491 Encounter for supervision of normal pregnancy, unspecified, first trimester: Secondary | ICD-10-CM | POA: Diagnosis not present

## 2018-10-26 LAB — GLUCOSE TOLERANCE, 2 HOURS W/ 1HR
Glucose, 1 hour: 190 mg/dL — ABNORMAL HIGH (ref 65–179)
Glucose, 2 hour: 148 mg/dL (ref 65–152)
Glucose, Fasting: 81 mg/dL (ref 65–91)

## 2018-10-27 ENCOUNTER — Other Ambulatory Visit: Payer: Self-pay

## 2018-10-27 DIAGNOSIS — O24419 Gestational diabetes mellitus in pregnancy, unspecified control: Secondary | ICD-10-CM

## 2018-10-27 LAB — CBC
Hematocrit: 34 % (ref 34.0–46.6)
Hemoglobin: 10.8 g/dL — ABNORMAL LOW (ref 11.1–15.9)
MCH: 24.9 pg — ABNORMAL LOW (ref 26.6–33.0)
MCHC: 31.8 g/dL (ref 31.5–35.7)
MCV: 79 fL (ref 79–97)
Platelets: 225 10*3/uL (ref 150–450)
RBC: 4.33 x10E6/uL (ref 3.77–5.28)
RDW: 13.4 % (ref 11.7–15.4)
WBC: 6.5 10*3/uL (ref 3.4–10.8)

## 2018-10-27 LAB — RPR: RPR Ser Ql: NONREACTIVE

## 2018-10-27 LAB — HIV ANTIBODY (ROUTINE TESTING W REFLEX): HIV Screen 4th Generation wRfx: NONREACTIVE

## 2018-10-27 MED ORDER — ACCU-CHEK GUIDE VI STRP: ORAL_STRIP | 12 refills | Status: DC

## 2018-10-27 MED ORDER — ACCU-CHEK FASTCLIX LANCETS MISC: 1.0000 [IU] | Freq: Four times a day (QID) | 12 refills | Status: DC

## 2018-10-27 MED ORDER — ACCU-CHEK GUIDE W/DEVICE KIT: 1.0000 | PACK | Freq: Four times a day (QID) | 0 refills | Status: DC

## 2018-10-27 NOTE — Progress Notes (Signed)
Sent as advised by Provider.

## 2018-10-31 ENCOUNTER — Telehealth (INDEPENDENT_AMBULATORY_CARE_PROVIDER_SITE_OTHER): Payer: Medicaid Other | Admitting: Obstetrics & Gynecology

## 2018-10-31 ENCOUNTER — Encounter: Payer: Self-pay | Admitting: Obstetrics & Gynecology

## 2018-10-31 ENCOUNTER — Other Ambulatory Visit: Payer: Self-pay

## 2018-10-31 DIAGNOSIS — O24112 Pre-existing diabetes mellitus, type 2, in pregnancy, second trimester: Secondary | ICD-10-CM

## 2018-10-31 DIAGNOSIS — O24119 Pre-existing diabetes mellitus, type 2, in pregnancy, unspecified trimester: Secondary | ICD-10-CM

## 2018-10-31 DIAGNOSIS — D563 Thalassemia minor: Secondary | ICD-10-CM | POA: Insufficient documentation

## 2018-10-31 DIAGNOSIS — Z1589 Genetic susceptibility to other disease: Secondary | ICD-10-CM | POA: Diagnosis not present

## 2018-10-31 DIAGNOSIS — O0992 Supervision of high risk pregnancy, unspecified, second trimester: Secondary | ICD-10-CM | POA: Diagnosis not present

## 2018-10-31 DIAGNOSIS — Z3A15 15 weeks gestation of pregnancy: Secondary | ICD-10-CM | POA: Diagnosis not present

## 2018-10-31 DIAGNOSIS — Z3689 Encounter for other specified antenatal screening: Secondary | ICD-10-CM

## 2018-10-31 DIAGNOSIS — O26892 Other specified pregnancy related conditions, second trimester: Secondary | ICD-10-CM | POA: Diagnosis not present

## 2018-10-31 MED ORDER — ASPIRIN EC 81 MG PO TBEC: 81.0000 mg | DELAYED_RELEASE_TABLET | Freq: Every day | ORAL | 2 refills | Status: DC

## 2018-10-31 NOTE — Patient Instructions (Signed)
Type 1 or Type 2 Diabetes Mellitus During Pregnancy, Self Care Caring for yourself during your pregnancy when you have type 1 diabetes (type 1 diabetes mellitus) or type 2 diabetes (type 2 diabetes mellitus) means keeping your blood sugar (glucose) under control with a balance of:  Nutrition.  Exercise.  Lifestyle changes.  Insulin or medicines, if necessary.  Support from your team of health care providers and others. The following information explains what you need to know to manage your diabetes at home during your pregnancy. What are the risks? If diabetes is treated, it is unlikely to cause problems. If it is not controlled with treatment, it may cause problems during labor and delivery, and some of those problems can be harmful to the unborn baby (fetus) and the mother. Uncontrolled diabetes may also cause the newborn baby to have breathing problems and low blood glucose. Having diabetes can put you at risk for other long-term (chronic) conditions, such as heart disease and kidney disease. Your health care provider may prescribe medicines to help prevent complications from diabetes. These medicines may include:  Aspirin.  Medicine to lower cholesterol.  Medicine to control blood pressure. How to monitor blood glucose   Check your blood glucose every day, as often as told by your health care provider.  Contact your health care provider if your blood glucose is above your target for 2 tests in a row.  Have your A1c (hemoglobin A1c) level checked at least two times a year, or as often as told by your health care provider. Your health care provider will set personal treatment goals for you. Generally, the goal of treatment is to maintain the following blood glucose levels during pregnancy:  After not eating (after fasting) for 8 hours: at or below 95 mg/dL (5.3 mmol/L).  After meals (postprandial): ? One hour after a meal: at or below 140 mg/dL (7.8 mmol/L). ? Two hours after a  meal: at or below 120 mg/dL (6.7 mmol/L).  A1c level: 6-6.5% How to manage hyperglycemia and hypoglycemia Hyperglycemia symptoms Hyperglycemia, also called high blood glucose, occurs when blood glucose is too high. Make sure you know the early signs of hyperglycemia, such as:  Increased thirst.  Hunger.  Feeling very tired.  Needing to urinate more often than usual.  Blurry vision. Hypoglycemia symptoms Hypoglycemia, also called low blood glucose, occurs with a blood glucose level at or below 70 mg/dL (3.9 mmol/L). The risk for hypoglycemia increases during or after exercise, during sleep, during illness, and when skipping meals or fasting. It is important to know the symptoms of hypoglycemia and treat it right away. Always have a 15-gram rapid-acting carbohydrate snack with you to treat low blood glucose. Family members and close friends should also know the symptoms and should understand how to treat hypoglycemia, in case you are not able to treat yourself. Symptoms may include:  Hunger.  Anxiety.  Sweating and feeling clammy.  Confusion.  Dizziness or feeling light-headed.  Sleepiness.  Nausea.  Increased heart rate.  Headache.  Blurry vision.  Irritability.  Tingling or numbness around the mouth, lips, or tongue.  A change in coordination.  Restless sleep.  Fainting.  Seizure. Treating hypoglycemia If you are alert and able to swallow safely, follow the 15:15 rule:  Take 15 grams of a rapid-acting carbohydrate. Rapid-acting options include: ? 1 tube of glucose gel. ? 3 glucose pills. ? 6-8 pieces of hard candy. ? 4 oz (120 mL) of fruit juice . ? 4 oz (120 mL)  of regular (not diet) soda.  Check your blood glucose 15 minutes after you take the carbohydrate.  If the repeat blood glucose level is still at or below 70 mg/dL (3.9 mmol/L), take 15 grams of a carbohydrate again.  If your blood glucose level does not increase above 70 mg/dL (3.9 mmol/L)  after 3 tries, seek emergency medical care.  After your blood glucose level returns to normal, eat a meal or a snack within 1 hour. Treating severe hypoglycemia Severe hypoglycemia is when your blood glucose level is at or below 54 mg/dL (3 mmol/L). Severe hypoglycemia is an emergency. Do not wait to see if the symptoms will go away. Get medical help right away. Call your local emergency services (911 in the [place] If you have severe hypoglycemia and you cannot eat or drink, you may need an injection of glucagon. A family member or close friend should learn how to check your blood glucose and how to give you a glucagon injection. Ask your health care provider if you need to have an emergency glucagon injection kit available. Severe hypoglycemia may need to be treated in a hospital. The treatment may include getting glucose through an IV. You may also need treatment for the cause of your hypoglycemia. Follow these instructions at home: Take diabetes medicines as told  If your health care provider prescribed insulin or diabetes medicines, take them every day.  Do not run out of insulin or other diabetes medicines that you take. Plan ahead so you always have these available.  If you use insulin, adjust your dosage based on how physically active you are and what foods you eat. Your health care provider will tell you how to adjust your dosage.  Your health care provider may recommend that you take one low-dose aspirin (81 mg) each day to help prevent high blood pressure during pregnancy (preeclampsia or eclampsia). You may be at risk for preeclampsia or eclampsia if: ? You had any of the following during a previous pregnancy:  Preeclampsia or eclampsia.  A fetal growth rate that was slower than normal.  An early (preterm) birth.  Separation of the placenta from the uterus (placental abruption).  Fetal loss. ? You are pregnant with more than one baby. ? You have other medical conditions, such  as high blood pressure or an autoimmune disease. Make healthy food choices  The things that you eat and drink affect your blood glucose and your insulin dosage. Making good choices helps to control your diabetes and prevent other health problems. A healthy meal plan includes eating lean proteins, complex carbohydrates, fresh fruits and vegetables, low-fat dairy products, and healthy fats. Make an appointment to see a diet and nutrition specialist (registered dietitian) to help you create an eating plan that is right for you. Make sure that you:  Follow instructions from your health care provider about eating or drinking restrictions.  Drink enough fluid to keep your urine pale yellow.  Eat healthy snacks between nutritious meals.  Keep a record of the carbohydrates that you eat. Do this by reading food labels and learning the standard serving sizes of foods.  Follow your sick day plan whenever you cannot eat or drink as usual. Make this plan in advance with your health care provider.  Stay active  Do at least 30 minutes of physical activity a day, or as much physical activity as your health care provider recommends during your pregnancy.  If you start a new exercise or activity, work with your health  care provider to adjust your insulin, medicines, or food intake as needed. Make healthy lifestyle choices  Do not drink alcohol.  Do not use any tobacco products, such as cigarettes, chewing tobacco, and e-cigarettes. If you need help quitting, ask your health care provider.  Learn to manage stress. If you need help with this, ask your health care provider. Care for your body  Keep your immunizations up to date.  Schedule an eye exam during your first trimester of your pregnancy, or as told by your health care provider.  Check your skin and feet every day for cuts, bruises, redness, blisters, or sores. Schedule a foot exam with your health care provider once every year.  Brush your  teeth and gums two times a day, and floss one or more times a day. Visit your dentist one or more times every 6 months.  Maintain a healthy weight during your pregnancy. General instructions  Take over-the-counter and prescription medicines only as told by your health care provider.  Talk with your health care provider about your risk for high blood pressure during pregnancy (preeclampsia or eclampsia).  Share your diabetes management plan with people in your workplace, school, and household.  Check your urine ketones when you are ill and as told by your health care provider.  Carry a medical alert card or wear medical alert jewelry.  Keep all follow-up visits during your pregnancy (prenatal) and after delivery (postnatal) as told by your health care provider. This is important. Questions to ask your health care provider  Do I need to meet with a diabetes educator?  Where can I find a support group for people with diabetes? Where to find more information For more information about diabetes, visit:  American Diabetes Association (ADA): www.diabetes.org  American Association of Diabetes Educators (AADE): www.diabeteseducator.org Summary  Caring for yourself when you have type 1 or type 2 diabetes means keeping your blood sugar (glucose) under control. You can do that with a balance of insulin and other medicines, nutrition, exercise, and lifestyle changes.  Check your blood glucose every day, as often as told by your health care provider.  If your health care provider prescribed insulin or diabetes medicines, take them every day.  Keep all follow-up visits during your pregnancy (prenatal) and after delivery (postnatal) as told by your health care provider. This is important. This information is not intended to replace advice given to you by your health care provider. Make sure you discuss any questions you have with your health care provider. Document Released: 08/05/2015 Document  Revised: 06/11/2017 Document Reviewed: 05/17/2015 Elsevier Patient Education  [place] of Pregnancy The second trimester is from week 14 through week 27 (months 4 through 6). The second trimester is often a time when you feel your best. Your body has adjusted to being pregnant, and you begin to feel better physically. Usually, morning sickness has lessened or quit completely, you may have more energy, and you may have an increase in appetite. The second trimester is also a time when the fetus is growing rapidly. At the end of the sixth month, the fetus is about 9 inches long and weighs about 1 pounds. You will likely begin to feel the baby move (quickening) between 16 and 20 weeks of pregnancy. Body changes during your second trimester Your body continues to go through many changes during your second trimester. The changes vary from woman to woman.  Your weight will continue to increase. You will notice  your lower abdomen bulging out.  You may begin to get stretch marks on your hips, abdomen, and breasts.  You may develop headaches that can be relieved by medicines. The medicines should be approved by your health care provider.  You may urinate more often because the fetus is pressing on your bladder.  You may develop or continue to have heartburn as a result of your pregnancy.  You may develop constipation because certain hormones are causing the muscles that push waste through your intestines to slow down.  You may develop hemorrhoids or swollen, bulging veins (varicose veins).  You may have back pain. This is caused by: ? Weight gain. ? Pregnancy hormones that are relaxing the joints in your pelvis. ? A shift in weight and the muscles that support your balance.  Your breasts will continue to grow and they will continue to become tender.  Your gums may bleed and may be sensitive to brushing and flossing.  Dark spots or blotches (chloasma, mask of  pregnancy) may develop on your face. This will likely fade after the baby is born.  A dark line from your belly button to the pubic area (linea nigra) may appear. This will likely fade after the baby is born.  You may have changes in your hair. These can include thickening of your hair, rapid growth, and changes in texture. Some women also have hair loss during or after pregnancy, or hair that feels dry or thin. Your hair will most likely return to normal after your baby is born. What to expect at prenatal visits During a routine prenatal visit:  You will be weighed to make sure you and the fetus are growing normally.  Your blood pressure will be taken.  Your abdomen will be measured to track your baby's growth.  The fetal heartbeat will be listened to.  Any test results from the previous visit will be discussed. Your health care provider may ask you:  How you are feeling.  If you are feeling the baby move.  If you have had any abnormal symptoms, such as leaking fluid, bleeding, severe headaches, or abdominal cramping.  If you are using any tobacco products, including cigarettes, chewing tobacco, and electronic cigarettes.  If you have any questions. Other tests that may be performed during your second trimester include:  Blood tests that check for: ? Low iron levels (anemia). ? High blood sugar that affects pregnant women (gestational diabetes) between 74 and 28 weeks. ? Rh antibodies. This is to check for a protein on red blood cells (Rh factor).  Urine tests to check for infections, diabetes, or protein in the urine.  An ultrasound to confirm the proper growth and development of the baby.  An amniocentesis to check for possible genetic problems.  Fetal screens for spina bifida and Down syndrome.  HIV (human immunodeficiency virus) testing. Routine prenatal testing includes screening for HIV, unless you choose not to have this test. Follow these instructions at home:  Medicines  Follow your health care provider's instructions regarding medicine use. Specific medicines may be either safe or unsafe to take during pregnancy.  Take a prenatal vitamin that contains at least 600 micrograms (mcg) of folic acid.  If you develop constipation, try taking a stool softener if your health care provider approves. Eating and drinking   Eat a balanced diet that includes fresh fruits and vegetables, whole grains, good sources of protein such as meat, eggs, or tofu, and low-fat dairy. Your health care provider will help  you determine the amount of weight gain that is right for you.  Avoid raw meat and uncooked cheese. These carry germs that can cause birth defects in the baby.  If you have low calcium intake from food, talk to your health care provider about whether you should take a daily calcium supplement.  Limit foods that are high in fat and processed sugars, such as fried and sweet foods.  To prevent constipation: ? Drink enough fluid to keep your urine clear or pale yellow. ? Eat foods that are high in fiber, such as fresh fruits and vegetables, whole grains, and beans. Activity  Exercise only as directed by your health care provider. Most women can continue their usual exercise routine during pregnancy. Try to exercise for 30 minutes at least 5 days a week. Stop exercising if you experience uterine contractions.  Avoid heavy lifting, wear low heel shoes, and practice good posture.  A sexual relationship may be continued unless your health care provider directs you otherwise. Relieving pain and discomfort  Wear a good support bra to prevent discomfort from breast tenderness.  Take warm sitz baths to soothe any pain or discomfort caused by hemorrhoids. Use hemorrhoid cream if your health care provider approves.  Rest with your legs elevated if you have leg cramps or low back pain.  If you develop varicose veins, wear support hose. Elevate your feet for 15  minutes, 3-4 times a day. Limit salt in your diet. Prenatal Care  Write down your questions. Take them to your prenatal visits.  Keep all your prenatal visits as told by your health care provider. This is important. Safety  Wear your seat belt at all times when driving.  Make a list of emergency phone numbers, including numbers for family, friends, the hospital, and police and fire departments. General instructions  Ask your health care provider for a referral to a local prenatal education class. Begin classes no later than the beginning of month 6 of your pregnancy.  Ask for help if you have counseling or nutritional needs during pregnancy. Your health care provider can offer advice or refer you to specialists for help with various needs.  Do not use hot tubs, steam rooms, or saunas.  Do not douche or use tampons or scented sanitary pads.  Do not cross your legs for long periods of time.  Avoid cat litter boxes and soil used by cats. These carry germs that can cause birth defects in the baby and possibly loss of the fetus by miscarriage or stillbirth.  Avoid all smoking, herbs, alcohol, and unprescribed drugs. Chemicals in these products can affect the formation and growth of the baby.  Do not use any products that contain nicotine or tobacco, such as cigarettes and e-cigarettes. If you need help quitting, ask your health care provider.  Visit your dentist if you have not gone yet during your pregnancy. Use a soft toothbrush to brush your teeth and be gentle when you floss. Contact a health care provider if:  You have dizziness.  You have mild pelvic cramps, pelvic pressure, or nagging pain in the abdominal area.  You have persistent nausea, vomiting, or diarrhea.  You have a bad smelling vaginal discharge.  You have pain when you urinate. Get help right away if:  You have a fever.  You are leaking fluid from your vagina.  You have spotting or bleeding from your  vagina.  You have severe abdominal cramping or pain.  You have rapid weight gain or  weight loss.  You have shortness of breath with chest pain.  You notice sudden or extreme swelling of your face, hands, ankles, feet, or legs.  You have not felt your baby move in over an hour.  You have severe headaches that do not go away when you take medicine.  You have vision changes. Summary  The second trimester is from week 14 through week 27 (months 4 through 6). It is also a time when the fetus is growing rapidly.  Your body goes through many changes during pregnancy. The changes vary from woman to woman.  Avoid all smoking, herbs, alcohol, and unprescribed drugs. These chemicals affect the formation and growth your baby.  Do not use any tobacco products, such as cigarettes, chewing tobacco, and e-cigarettes. If you need help quitting, ask your health care provider.  Contact your health care provider if you have any questions. Keep all prenatal visits as told by your health care provider. This is important. This information is not intended to replace advice given to you by your health care provider. Make sure you discuss any questions you have with your health care provider. Document Released: 04/07/2001 Document Revised: 08/05/2018 Document Reviewed: 05/19/2016 Elsevier Patient Education  2020 [employer].

## 2018-10-31 NOTE — Progress Notes (Signed)
TELEHEALTH OBSTETRICS PRENATAL VIRTUAL VIDEO VISIT ENCOUNTER NOTE  Provider location: [facility] [place]   I connected with Alexis Ali on 10/31/18 at 11:00 AM EDT by MyChart Video Encounter at home and verified that I am speaking with the correct person using two identifiers.   I discussed the limitations, risks, security and privacy concerns of performing an evaluation and management service by telephone and the availability of in person appointments. I also discussed with the patient that there may be a patient responsible charge related to this service. The patient expressed understanding and agreed to proceed. Subjective:  Alexis Ali is a 33 y.o. G2P1001 at [redacted]w[redacted]d being seen today for ongoing prenatal care.  She is currently monitored for the following issues for this high-risk pregnancy and has Supervision of high-risk pregnancy; Type 2 diabetes mellitus affecting pregnancy, antepartum; Alpha thalassemia silent carrier (aa/a-); and Monoallelic mutation of SMN1 gene on their problem list.  Patient reports no complaints.  Contractions: Not present. Vag. Bleeding: None.  Movement: Absent. Denies any leaking of fluid.   The following portions of the patient's history were reviewed and updated as appropriate: allergies, current medications, past family history, past medical history, past social history, past surgical history and problem list.   Objective:  There were no vitals filed for this visit.  Patient not on Babyscripts yet.   Fetal Status:     Movement: Absent     General:  Alert, oriented and cooperative. Patient is in no acute distress.  Respiratory: Normal respiratory effort, no problems with respiration noted  Mental Status: Normal mood and affect. Normal behavior. Normal judgment and thought content.  Rest of physical exam deferred due to type of encounter  Imaging: No results found.  Assessment and Plan:  Pregnancy: G2P1001 at [redacted]w[redacted]d  1. Type 2  diabetes mellitus affecting pregnancy, antepartum Diagnosed at 14 weeks, h/o GDM last pregnancy.  No testing after pregnancy; likely has T2DM. She has supplies and is checking BS, cannot recall values (she is driving and pap is at home). Emphasized importance of being able to review BS during all encounters.  Discussed implications of M0QQ in pregnancy, need for optimizing glycemic control to decrease DM associated maternal-fetal morbidity and mortality, need for antenatal testing and frequent ultrasounds/prenatal visits. All questions answered.  - aspirin EC 81 MG tablet; Take 1 tablet (81 mg total) by mouth daily. Take after 12 weeks for prevention of preeclampsia later in pregnancy  Dispense: 300 tablet; Refill: 2 - [place] MFM OB DETAIL +14 WK; Future - US Fetal Echocardiography; Future - Referral to Nutrition and Diabetes Services - Ambulatory referral to Ophthalmology  2. Alpha thalassemia silent carrier 3. Monoallelic mutation of SMN1 gene Noted on [facility] testing. Results discussed with patient, FOB testing/Genetic counseling recommended. - AMB MFM GENETICS REFERRAL  4. Encounter for fetal anatomic survey Anatomy scan ordered - US MFM OB DETAIL +14 WK; Future  5. Supervision of high risk pregnancy in second trimester Advised patient to sign up for Babyscripts. Once she is signed up, can transition her to the [facility] platform. Low risk female on NIPS, AFP ordered and will be done on the day of her anatomy scan. - Enroll Patient in Babyscripts - Babyscripts Schedule Optimization - AFP, Serum, Open Spina Bifida; Future  No other complaints or concerns.  Routine obstetric precautions reviewed.  I discussed the assessment and treatment plan with the patient. The patient was provided an opportunity to ask questions and all were answered. The  patient agreed with the plan and demonstrated an understanding of the instructions. The patient was advised to call back or seek an in-person office  evaluation/[facility] for any urgent or concerning symptoms. Please refer to After Visit Summary for other counseling recommendations.   I provided 15 minutes of face-to-face time during this encounter.  Return in about 2 weeks (around 11/14/2018) for Virtual OB Visit, review BS.   Jaynie CollinsUgonna Roth Ress, MD [facility]

## 2018-10-31 NOTE — Progress Notes (Signed)
MyChart OB. She is unable to check BP because she is not at home right now.

## 2018-11-14 ENCOUNTER — Ambulatory Visit (INDEPENDENT_AMBULATORY_CARE_PROVIDER_SITE_OTHER): Payer: Medicaid Other | Admitting: Obstetrics and Gynecology

## 2018-11-14 ENCOUNTER — Encounter (HOSPITAL_BASED_OUTPATIENT_CLINIC_OR_DEPARTMENT_OTHER): Payer: Self-pay | Admitting: *Deleted

## 2018-11-14 ENCOUNTER — Ambulatory Visit (HOSPITAL_COMMUNITY): Payer: Medicaid Other

## 2018-11-14 ENCOUNTER — Other Ambulatory Visit: Payer: Self-pay

## 2018-11-14 ENCOUNTER — Ambulatory Visit (HOSPITAL_COMMUNITY)
Admission: RE | Admit: 2018-11-14 | Discharge: 2018-11-14 | Disposition: A | Discharge status: Home / Self Care | Payer: Medicaid Other | Source: Ambulatory Visit | Attending: Obstetrics and Gynecology | Admitting: Obstetrics and Gynecology

## 2018-11-14 ENCOUNTER — Other Ambulatory Visit: Payer: Self-pay | Admitting: Obstetrics & Gynecology

## 2018-11-14 ENCOUNTER — Encounter: Payer: Self-pay | Admitting: Obstetrics and Gynecology

## 2018-11-14 ENCOUNTER — Telehealth: Payer: Self-pay

## 2018-11-14 VITALS — BP 132/84 | HR 84 | Wt 242.8 lb

## 2018-11-14 DIAGNOSIS — O3680X0 Pregnancy with inconclusive fetal viability, not applicable or unspecified: Secondary | ICD-10-CM

## 2018-11-14 DIAGNOSIS — O0992 Supervision of high risk pregnancy, unspecified, second trimester: Secondary | ICD-10-CM | POA: Insufficient documentation

## 2018-11-14 DIAGNOSIS — Z3A17 17 weeks gestation of pregnancy: Secondary | ICD-10-CM | POA: Diagnosis not present

## 2018-11-14 DIAGNOSIS — O99012 Anemia complicating pregnancy, second trimester: Secondary | ICD-10-CM | POA: Diagnosis not present

## 2018-11-14 DIAGNOSIS — O24410 Gestational diabetes mellitus in pregnancy, diet controlled: Secondary | ICD-10-CM | POA: Diagnosis not present

## 2018-11-14 DIAGNOSIS — O24119 Pre-existing diabetes mellitus, type 2, in pregnancy, unspecified trimester: Secondary | ICD-10-CM | POA: Diagnosis not present

## 2018-11-14 DIAGNOSIS — O24112 Pre-existing diabetes mellitus, type 2, in pregnancy, second trimester: Secondary | ICD-10-CM

## 2018-11-14 DIAGNOSIS — Z148 Genetic carrier of other disease: Secondary | ICD-10-CM | POA: Diagnosis not present

## 2018-11-14 NOTE — Progress Notes (Signed)
Pt is here for ROB, [redacted]w[redacted]d. Pt reports that she sometimes checks her blood sugar at home- she is unsure of what the levels have been, states she left the paper at home.

## 2018-11-14 NOTE — Progress Notes (Signed)
   PRENATAL VISIT NOTE  Subjective:  Alexis Ali is a 33 y.o. G2P1001 at [redacted]w[redacted]d being seen today for ongoing prenatal care.  She is currently monitored for the following issues for this high-risk pregnancy and has Supervision of high-risk pregnancy; Type 2 diabetes mellitus affecting pregnancy, antepartum; Alpha thalassemia silent carrier (aa/a-); and Monoallelic mutation of SMN1 gene on their problem list.  Patient reports no complaints.  Contractions: Not present. Vag. Bleeding: None.  Movement: Present. Denies leaking of fluid.   The following portions of the patient's history were reviewed and updated as appropriate: allergies, current medications, past family history, past medical history, past social history, past surgical history and problem list.   Objective:   Vitals:   11/14/18 1102  BP: 132/84  Pulse: 84  Weight: 242 lb 12.8 oz (110.1 kg)    Fetal Status:     Movement: Present     General:  Alert, oriented and cooperative. Patient is in no acute distress.  Skin: Skin is warm and dry. No rash noted.   Cardiovascular: Normal heart rate noted  Respiratory: Normal respiratory effort, no problems with respiration noted  Abdomen: Soft, gravid, appropriate for gestational age.  Pain/Pressure: Absent     Pelvic: Cervical exam deferred        Extremities: Normal range of motion.  Edema: Trace  Mental Status: Normal mood and affect. Normal behavior. Normal judgment and thought content.   Assessment and Plan:  Pregnancy: G2P1001 at [redacted]w[redacted]d 1. Supervision of high risk pregnancy in second trimester Patient is doing well without complaints Anatomy ultrasound on 12/02/18 Unable to obtain fetal heart tones on doppler  Pt informed that the ultrasound is considered a limited OB ultrasound and is not intended to be a complete ultrasound exam.  Patient also informed that the ultrasound is not being completed with the intent of assessing for fetal or placental anomalies or any pelvic  abnormalities.  Explained that the purpose of today's ultrasound is to assess for  viability.  Patient acknowledges the purpose of the exam and the limitations of the study.  Unable to visualize fetal heart rate motion on portable ultrasound. Patient sent to MFM for viability ultrasound    2. Type 2 diabetes mellitus affecting pregnancy, antepartum Patient reports checking CBGs occasionally. She is unable to recall any numbers Patient scheduled to meet with nutritionist on 8/5   Preterm labor symptoms and general obstetric precautions including but not limited to vaginal bleeding, contractions, leaking of fluid and fetal movement were reviewed in detail with the patient. Please refer to After Visit Summary for other counseling recommendations.   Return in about 4 weeks (around 12/12/2018) for [facility], [place].  Future Appointments  Date Time Provider [place]  11/14/2018 11:15 AM Alechia Lezama, Vickii Chafe, MD [place] None  11/30/2018 10:15 AM Ruby Cola, RD [facility] NDM  12/02/2018 11:00 AM [facility] NURSE [facility]FC MFC-US  12/02/2018 11:00 AM [facility] [place] 3 WH-MFCUS MFC-US  12/02/2018 12:00 PM [place] GENETIC COUNSELING RM WH-[facility] MFC-US    Mora Bellman, MD

## 2018-11-14 NOTE — Telephone Encounter (Signed)
Called and spoke w/ Alexis Ali, surgery date & time given. Pre-Op instructions given, NPO after midnight, no lotion, no powder, no perfume, [place] bit of deodorant it okay, remove all jewelry, have someone available to drive you. Patient expressed understanding. Advise to call back should any questions arise.

## 2018-11-15 ENCOUNTER — Other Ambulatory Visit (HOSPITAL_COMMUNITY): Payer: Medicaid Other

## 2018-11-15 ENCOUNTER — Other Ambulatory Visit (HOSPITAL_COMMUNITY)
Admission: RE | Admit: 2018-11-15 | Discharge: 2018-11-15 | Disposition: A | Discharge status: Home / Self Care | Payer: Medicaid Other | Source: Ambulatory Visit | Attending: Obstetrics & Gynecology | Admitting: Obstetrics & Gynecology

## 2018-11-15 DIAGNOSIS — Z1159 Encounter for screening for other viral diseases: Secondary | ICD-10-CM | POA: Diagnosis not present

## 2018-11-15 LAB — SARS CORONAVIRUS 2 (TAT 6-24 HRS): SARS Coronavirus 2: NEGATIVE

## 2018-11-16 ENCOUNTER — Ambulatory Visit (HOSPITAL_COMMUNITY): Payer: Medicaid Other

## 2018-11-16 ENCOUNTER — Ambulatory Visit (HOSPITAL_BASED_OUTPATIENT_CLINIC_OR_DEPARTMENT_OTHER): Payer: Medicaid Other | Admitting: Certified Registered Nurse Anesthetist

## 2018-11-16 ENCOUNTER — Other Ambulatory Visit: Payer: Self-pay

## 2018-11-16 ENCOUNTER — Encounter (HOSPITAL_BASED_OUTPATIENT_CLINIC_OR_DEPARTMENT_OTHER): Payer: Self-pay | Admitting: Anesthesiology

## 2018-11-16 ENCOUNTER — Ambulatory Visit (HOSPITAL_BASED_OUTPATIENT_CLINIC_OR_DEPARTMENT_OTHER)
Admission: RE | Admit: 2018-11-16 | Discharge: 2018-11-16 | Disposition: A | Discharge status: Home / Self Care | Payer: Medicaid Other | Attending: Obstetrics & Gynecology | Admitting: Obstetrics & Gynecology

## 2018-11-16 ENCOUNTER — Encounter (HOSPITAL_BASED_OUTPATIENT_CLINIC_OR_DEPARTMENT_OTHER)
Admission: RE | Disposition: A | Discharge status: Home / Self Care | Payer: Self-pay | Source: Home / Self Care | Attending: Obstetrics & Gynecology

## 2018-11-16 DIAGNOSIS — O24912 Unspecified diabetes mellitus in pregnancy, second trimester: Secondary | ICD-10-CM | POA: Diagnosis not present

## 2018-11-16 DIAGNOSIS — O24119 Pre-existing diabetes mellitus, type 2, in pregnancy, unspecified trimester: Secondary | ICD-10-CM | POA: Diagnosis present

## 2018-11-16 DIAGNOSIS — Z3A14 14 weeks gestation of pregnancy: Secondary | ICD-10-CM | POA: Diagnosis not present

## 2018-11-16 DIAGNOSIS — Z7982 Long term (current) use of aspirin: Secondary | ICD-10-CM | POA: Insufficient documentation

## 2018-11-16 DIAGNOSIS — Z8632 Personal history of gestational diabetes: Secondary | ICD-10-CM | POA: Diagnosis present

## 2018-11-16 DIAGNOSIS — Z1589 Genetic susceptibility to other disease: Secondary | ICD-10-CM

## 2018-11-16 DIAGNOSIS — O0990 Supervision of high risk pregnancy, unspecified, unspecified trimester: Secondary | ICD-10-CM

## 2018-11-16 DIAGNOSIS — O021 Missed abortion: Secondary | ICD-10-CM | POA: Diagnosis not present

## 2018-11-16 DIAGNOSIS — Z79899 Other long term (current) drug therapy: Secondary | ICD-10-CM | POA: Diagnosis not present

## 2018-11-16 DIAGNOSIS — D563 Thalassemia minor: Secondary | ICD-10-CM | POA: Diagnosis present

## 2018-11-16 HISTORY — PX: OPERATIVE ULTRASOUND: SHX5996

## 2018-11-16 HISTORY — PX: DILATION AND EVACUATION: SHX1459

## 2018-11-16 LAB — CBC
HCT: 35.6 % — ABNORMAL LOW (ref 36.0–46.0)
Hemoglobin: 11.2 g/dL — ABNORMAL LOW (ref 12.0–15.0)
MCH: 24.8 pg — ABNORMAL LOW (ref 26.0–34.0)
MCHC: 31.5 g/dL (ref 30.0–36.0)
MCV: 78.9 fL — ABNORMAL LOW (ref 80.0–100.0)
Platelets: 252 10*3/uL (ref 150–400)
RBC: 4.51 MIL/uL (ref 3.87–5.11)
RDW: 14.1 % (ref 11.5–15.5)
WBC: 5 10*3/uL (ref 4.0–10.5)
nRBC: 0 % (ref 0.0–0.2)

## 2018-11-16 LAB — BASIC METABOLIC PANEL
Anion gap: 9 (ref 5–15)
BUN: 9 mg/dL (ref 6–20)
CO2: 18 mmol/L — ABNORMAL LOW (ref 22–32)
Calcium: 9.4 mg/dL (ref 8.9–10.3)
Chloride: 111 mmol/L (ref 98–111)
Creatinine, Ser: 0.59 mg/dL (ref 0.44–1.00)
GFR calc Af Amer: >60 mL/min (ref >60)
GFR calc non Af Amer: >60 mL/min (ref >60)
Glucose, Bld: 91 mg/dL (ref 70–99)
Potassium: 3.8 mmol/L (ref 3.5–5.1)
Sodium: 138 mmol/L (ref 135–145)

## 2018-11-16 LAB — GLUCOSE, CAPILLARY
Glucose-Capillary: 82 mg/dL (ref 70–99)
Glucose-Capillary: 73 mg/dL (ref 70–99)

## 2018-11-16 SURGERY — DILATION AND EVACUATION, UTERUS
Anesthesia: General | Site: Vagina

## 2018-11-16 MED ORDER — BUPIVACAINE HCL (PF) 0.5 % IJ SOLN
INTRAMUSCULAR | Status: AC
  Filled 2018-11-16: qty 30

## 2018-11-16 MED ORDER — LACTATED RINGERS IV SOLN
INTRAVENOUS | Status: DC
  Administered 2018-11-16: 10:00:00 via INTRAVENOUS

## 2018-11-16 MED ORDER — IBUPROFEN 600 MG PO TABS: 600.0000 mg | ORAL_TABLET | Freq: Four times a day (QID) | ORAL | 2 refills | Status: DC | PRN

## 2018-11-16 MED ORDER — DEXAMETHASONE SODIUM PHOSPHATE 4 MG/ML IJ SOLN
INTRAMUSCULAR | Status: DC | PRN
  Administered 2018-11-16: 10 mg via INTRAVENOUS

## 2018-11-16 MED ORDER — OXYTOCIN 10 UNIT/ML IJ SOLN
INTRAMUSCULAR | Status: AC
  Filled 2018-11-16: qty 2

## 2018-11-16 MED ORDER — DOXYCYCLINE HYCLATE 100 MG IV SOLR
200.0000 mg | Freq: Once | INTRAVENOUS | Status: AC
  Administered 2018-11-16 (×2): 200 mg via INTRAVENOUS

## 2018-11-16 MED ORDER — MIDAZOLAM HCL 2 MG/2ML IJ SOLN
1.0000 mg | INTRAMUSCULAR | Status: DC | PRN
  Administered 2018-11-16: 2 mg via INTRAVENOUS

## 2018-11-16 MED ORDER — DEXAMETHASONE SODIUM PHOSPHATE 10 MG/ML IJ SOLN
INTRAMUSCULAR | Status: AC
  Filled 2018-11-16: qty 1

## 2018-11-16 MED ORDER — FENTANYL CITRATE (PF) 100 MCG/2ML IJ SOLN
50.0000 ug | INTRAMUSCULAR | Status: DC | PRN
  Administered 2018-11-16 (×2): 50 ug via INTRAVENOUS

## 2018-11-16 MED ORDER — FENTANYL CITRATE (PF) 100 MCG/2ML IJ SOLN
INTRAMUSCULAR | Status: AC
  Filled 2018-11-16: qty 2

## 2018-11-16 MED ORDER — ONDANSETRON HCL 4 MG/2ML IJ SOLN
INTRAMUSCULAR | Status: DC | PRN
  Administered 2018-11-16: 4 mg via INTRAVENOUS

## 2018-11-16 MED ORDER — LACTATED RINGERS IV SOLN: INTRAVENOUS | Status: DC

## 2018-11-16 MED ORDER — KETOROLAC TROMETHAMINE 30 MG/ML IJ SOLN
INTRAMUSCULAR | Status: DC | PRN
  Administered 2018-11-16: 30 mg via INTRAVENOUS

## 2018-11-16 MED ORDER — PROPOFOL 500 MG/50ML IV EMUL
INTRAVENOUS | Status: AC
  Filled 2018-11-16: qty 50

## 2018-11-16 MED ORDER — DOXYCYCLINE HYCLATE 100 MG IV SOLR: 200.0000 mg | Freq: Once | INTRAVENOUS | Status: DC

## 2018-11-16 MED ORDER — FENTANYL CITRATE (PF) 100 MCG/2ML IJ SOLN: 25.0000 ug | INTRAMUSCULAR | Status: DC | PRN

## 2018-11-16 MED ORDER — LIDOCAINE 2% (20 MG/ML) 5 ML SYRINGE
INTRAMUSCULAR | Status: DC | PRN
  Administered 2018-11-16: 60 mg via INTRAVENOUS

## 2018-11-16 MED ORDER — MIDAZOLAM HCL 2 MG/2ML IJ SOLN
INTRAMUSCULAR | Status: AC
  Filled 2018-11-16: qty 2

## 2018-11-16 MED ORDER — METHYLERGONOVINE MALEATE 0.2 MG/ML IJ SOLN
INTRAMUSCULAR | Status: DC | PRN
  Administered 2018-11-16: 0.2 mg via INTRAMUSCULAR

## 2018-11-16 MED ORDER — LIDOCAINE 2% (20 MG/ML) 5 ML SYRINGE
INTRAMUSCULAR | Status: AC
  Filled 2018-11-16: qty 5

## 2018-11-16 MED ORDER — SODIUM CHLORIDE 0.9 % IV SOLN
INTRAVENOUS | Status: AC
  Filled 2018-11-16 (×2): qty 100

## 2018-11-16 MED ORDER — ONDANSETRON HCL 4 MG/2ML IJ SOLN
INTRAMUSCULAR | Status: AC
  Filled 2018-11-16: qty 2

## 2018-11-16 MED ORDER — BUPIVACAINE HCL 0.5 % IJ SOLN
INTRAMUSCULAR | Status: DC | PRN
  Administered 2018-11-16: 30 mL

## 2018-11-16 MED ORDER — METOCLOPRAMIDE HCL 5 MG/ML IJ SOLN: 10.0000 mg | Freq: Once | INTRAMUSCULAR | Status: DC | PRN

## 2018-11-16 MED ORDER — MISOPROSTOL 200 MCG PO TABS
ORAL_TABLET | ORAL | Status: AC
  Filled 2018-11-16: qty 5

## 2018-11-16 MED ORDER — SCOPOLAMINE 1 MG/3DAYS TD PT72: 1.0000 | MEDICATED_PATCH | Freq: Once | TRANSDERMAL | Status: DC

## 2018-11-16 MED ORDER — DOCUSATE SODIUM 100 MG PO CAPS: 100.0000 mg | ORAL_CAPSULE | Freq: Two times a day (BID) | ORAL | 2 refills | Status: DC | PRN

## 2018-11-16 MED ORDER — METHYLERGONOVINE MALEATE 0.2 MG/ML IJ SOLN
INTRAMUSCULAR | Status: AC
  Filled 2018-11-16: qty 1

## 2018-11-16 MED ORDER — CARBOPROST TROMETHAMINE 250 MCG/ML IM SOLN
INTRAMUSCULAR | Status: AC
  Filled 2018-11-16: qty 1

## 2018-11-16 MED ORDER — MEPERIDINE HCL 25 MG/ML IJ SOLN: 6.2500 mg | INTRAMUSCULAR | Status: DC | PRN

## 2018-11-16 MED ORDER — OXYCODONE-ACETAMINOPHEN 5-325 MG PO TABS: 1.0000 | ORAL_TABLET | ORAL | 0 refills | Status: DC | PRN

## 2018-11-16 MED ORDER — PROPOFOL 10 MG/ML IV BOLUS
INTRAVENOUS | Status: DC | PRN
  Administered 2018-11-16: 200 mg via INTRAVENOUS

## 2018-11-16 SURGICAL SUPPLY — 25 items
BRIEF STRETCH FOR OB PAD XXL (UNDERPADS AND DIAPERS) ×3 IMPLANT
CATH ROBINSON RED A/P 14FR (CATHETERS) ×3 IMPLANT
DECANTER SPIKE VIAL GLASS SM (MISCELLANEOUS) ×3 IMPLANT
FILTER UTR ASPR ASSEMBLY (MISCELLANEOUS) IMPLANT
GLOVE BIOGEL PI IND STRL 7.0 (GLOVE) ×1 IMPLANT
GLOVE BIOGEL PI INDICATOR 7.0 (GLOVE) ×2
GLOVE ECLIPSE 7.0 STRL STRAW (GLOVE) ×3 IMPLANT
GOWN STRL REUS W/ TWL LRG LVL3 (GOWN DISPOSABLE) ×1 IMPLANT
GOWN STRL REUS W/TWL LRG LVL3 (GOWN DISPOSABLE) ×6 IMPLANT
HOSE CONNECTING 18IN BERKELEY (TUBING) ×3 IMPLANT
KIT BERKELEY 1ST TRIMESTER 3/8 (MISCELLANEOUS) ×3 IMPLANT
NS IRRIG 1000ML POUR BTL (IV SOLUTION) ×3 IMPLANT
PACK VAGINAL MINOR WOMEN LF (CUSTOM PROCEDURE TRAY) ×3 IMPLANT
PAD OB MATERNITY 4.3X12.25 (PERSONAL CARE ITEMS) ×3 IMPLANT
PAD PREP 24X48 CUFFED NSTRL (MISCELLANEOUS) ×3 IMPLANT
SET BERKELEY SUCTION TUBING (SUCTIONS) ×3 IMPLANT
TOWEL GREEN STERILE FF (TOWEL DISPOSABLE) ×3 IMPLANT
TRAP TISSUE FILTER (MISCELLANEOUS) IMPLANT
TUBE VACURETTE 2ND TRIMESTER (CANNULA) ×2 IMPLANT
VACURETTE 10 RIGID CVD (CANNULA) IMPLANT
VACURETTE 16MM ASPIR CVD .5 (CANNULA) ×2 IMPLANT
VACURETTE 6 ASPIR F TIP BERK (CANNULA) IMPLANT
VACURETTE 7MM CVD STRL WRAP (CANNULA) IMPLANT
VACURETTE 8 RIGID CVD (CANNULA) IMPLANT
VACURETTE 9 RIGID CVD (CANNULA) IMPLANT

## 2018-11-16 NOTE — Anesthesia Postprocedure Evaluation (Signed)
Anesthesia Post Note  Patient: Alexis Ali  Procedure(s) Performed: DILATATION AND EVACUATION (N/A Vagina ) OPERATIVE ULTRASOUND (N/A Vagina )     Patient location during evaluation: PACU Anesthesia Type: General Level of consciousness: awake and alert Pain management: pain level controlled Vital Signs Assessment: post-procedure vital signs reviewed and stable Respiratory status: spontaneous breathing, nonlabored ventilation, respiratory function stable and patient connected to nasal cannula oxygen Cardiovascular status: blood pressure returned to baseline and stable Postop Assessment: no apparent nausea or vomiting Anesthetic complications: no    Last Vitals:  Vitals:   11/16/18 1200 11/16/18 1230  BP: 121/60 123/78  Pulse: 80 71  Resp: 20 18  Temp: 36.4 C 36.4 C  SpO2: 97% 98%    Last Pain:  Vitals:   11/16/18 1230  TempSrc:   PainSc: 0-No pain                 Montez Hageman

## 2018-11-16 NOTE — Anesthesia Preprocedure Evaluation (Signed)
Anesthesia Evaluation  Patient identified by MRN, date of birth, ID band Patient awake    Reviewed: Allergy & Precautions, NPO status , Patient's Chart, lab work & pertinent test results  Airway Mallampati: II  TM Distance: >3 FB Neck ROM: Full    Dental no notable dental hx.    Pulmonary neg pulmonary ROS,    Pulmonary exam normal breath sounds clear to auscultation       Cardiovascular negative cardio ROS Normal cardiovascular exam Rhythm:Regular Rate:Normal     Neuro/Psych negative neurological ROS  negative psych ROS   GI/Hepatic negative GI ROS, Neg liver ROS,   Endo/Other  diabetes, Type 2Morbid obesity  Renal/GU negative Renal ROS  negative genitourinary   Musculoskeletal negative musculoskeletal ROS (+)   Abdominal   Peds negative pediatric ROS (+)  Hematology negative hematology ROS (+)   Anesthesia Other Findings   Reproductive/Obstetrics negative OB ROS                             Anesthesia Physical Anesthesia Plan  ASA: II  Anesthesia Plan: General   Post-op Pain Management:    Induction: Intravenous  PONV Risk Score and Plan: 3 and Ondansetron, Dexamethasone and Treatment may vary due to age or medical condition  Airway Management Planned: LMA  Additional Equipment:   Intra-op Plan:   Post-operative Plan: Extubation in OR  Informed Consent: I have reviewed the patients History and Physical, chart, labs and discussed the procedure including the risks, benefits and alternatives for the proposed anesthesia with the patient or authorized representative who has indicated his/her understanding and acceptance.     Dental advisory given  Plan Discussed with: CRNA  Anesthesia Plan Comments:         Anesthesia Quick Evaluation

## 2018-11-16 NOTE — Anesthesia Procedure Notes (Signed)
Procedure Name: LMA Insertion Date/Time: 11/16/2018 11:00 AM Performed by: Lieutenant Diego, CRNA Pre-anesthesia Checklist: Patient identified, Emergency Drugs available, Suction available and Patient being monitored Patient Re-evaluated:Patient Re-evaluated prior to induction Oxygen Delivery Method: Circle system utilized Preoxygenation: Pre-oxygenation with 100% oxygen Induction Type: IV induction Ventilation: Mask ventilation without difficulty LMA: LMA inserted LMA Size: 4.0 Number of attempts: 1 Placement Confirmation: positive ETCO2 and breath sounds checked- equal and bilateral Tube secured with: Tape Dental Injury: Teeth and Oropharynx as per pre-operative assessment

## 2018-11-16 NOTE — Discharge Instructions (Addendum)
Managing Pregnancy Loss °Pregnancy loss can happen any time during a pregnancy. Often the cause is not known. It is rarely because of anything you did. Pregnancy loss in early pregnancy (during the first trimester) is called a miscarriage. This type of pregnancy loss is the most common. Pregnancy loss that happens after 20 weeks of pregnancy is called fetal demise if the baby's heart stops beating before birth. Fetal demise is much less common. Some women experience spontaneous labor shortly after fetal demise resulting in a stillborn birth (stillbirth). °Any pregnancy loss can be devastating. You will need to recover both physically and emotionally. Most women are able to get pregnant again after a pregnancy loss and deliver a healthy baby. °How to manage emotional recovery ° °Pregnancy loss is very hard emotionally. You may feel many different emotions while you grieve. You may feel sad and angry. You may also feel guilty. It is normal to have periods of crying. Emotional recovery can take longer than physical recovery. It is different for everyone. °Taking these steps can help you in managing this loss: °· Remember that it is unlikely you did anything to cause the pregnancy loss. °· Share your thoughts and feelings with friends, family, and your partner. Remember that your partner is also recovering emotionally. °· Make sure you have a good support system. Do not spend too much time alone. °· Meet with a pregnancy loss counselor or join a pregnancy loss support group. °· Get enough sleep and eat a healthy diet. Return to regular exercise when you have recovered physically. °· Do not use drugs or alcohol to manage your emotions. °· Consider seeing a mental health professional to help you recover emotionally. °· Ask a friend or loved one to help you decide what to do with any clothing and nursery items you received for your baby. °In the case of a stillbirth, many women benefit from taking additional steps in the  grieving process. You may want to: °· Hold your baby after the birth. °· Name your baby. °· Request a birth certificate. °· Create a keepsake such as handprints or footprints. °· Dress your baby and have a picture taken. °· Make funeral arrangements. °· Ask for a baptism or blessing. °Hospitals have staff members who can help you with all these arrangements. °How to recognize emotional stress °It is normal to have emotional stress after a pregnancy loss. But emotional stress that lasts a long time or becomes severe requires treatment. Watch out for these signs of severe emotional stress: °· Sadness, anger, or guilt that is not going away and is interfering with your normal activities. °· Relationship problems that have occurred or gotten worse since the pregnancy loss. °· Signs of depression that last longer than 2 weeks. These may include: °? Sadness. °? Anxiety. °? Hopelessness. °? Loss of interest in activities you enjoy. °? Inability to concentrate. °? Trouble sleeping or sleeping too much. °? Loss of appetite or overeating. °? Thoughts of death or of hurting yourself. °Follow these instructions at home: °· Take over-the-counter and prescription medicines only as told by your health care provider. °· Rest at home until your energy level returns. Return to your normal activities as told by your health care provider. Ask your health care provider what activities are safe for you. °· When you are ready, meet with your health care provider to discuss steps to take for a future pregnancy. °· Keep all follow-up visits as told by your health care provider. This is important. °  Where to find support  To help you and your partner with the process of grieving, talk with your health care provider or seek counseling.  Consider meeting with others who have experienced pregnancy loss. Ask your health care provider about support groups and resources. Where to find more information  [place] Department of Health and Holiday representative on Women's Health: [place]  American Pregnancy Association: www.americanpregnancy.org Contact a health care provider if:  You continue to experience grief, sadness, or lack of motivation for everyday activities, and those feelings do not improve over time.  You are struggling to recover emotionally, especially if you are using alcohol or substances to help. Get help right away if:  You have thoughts of hurting yourself or others. If you ever feel like you may hurt yourself or others, or have thoughts about taking your own life, get help right away. You can go to your nearest emergency department or call:  Your local emergency services (911 in the [place]  A suicide crisis helpline, such as the [facility] at (506) 736-3339. This is open 24 hours a day. Summary  Any pregnancy loss can be difficult physically and emotionally.  You may experience many different emotions while you grieve. Emotional recovery can last longer than physical recovery.  It is normal to have emotional stress after a pregnancy loss. But emotional stress that lasts a long time or becomes severe requires treatment.  See your health care provider if you are struggling emotionally after a pregnancy loss. This information is not intended to replace advice given to you by your health care provider. Make sure you discuss any questions you have with your health care provider. Document Released: 06/24/2017 Document Revised: 08/03/2018 Document Reviewed: 06/24/2017 Elsevier Patient Education  2020 [place].    Dilation and Curettage or Vacuum Curettage, Care After These instructions give you information about caring for yourself after your procedure. Your doctor may also give you more specific instructions. Call your doctor if you have any problems or questions after your procedure. Follow these instructions at home: Activity  Do not drive or use heavy machinery  while taking prescription pain medicine.  For 24 hours after your procedure, avoid driving.  Take short walks often, followed by rest periods. Ask your doctor what activities are safe for you. After one or two days, you may be able to return to your normal activities.  Do not lift anything that is heavier than 10 lb (4.5 kg) until your doctor approves.  For at least 2 weeks, or as long as told by your doctor: ? Do not douche. ? Do not use tampons. ? Do not have sex. General instructions   Take over-the-counter and prescription medicines only as told by your doctor. This is very important if you take blood thinning medicine.  Do not take baths, swim, or use a hot tub until your doctor approves. Take showers instead of baths.  Wear compression stockings as told by your doctor.  It is up to you to get the results of your procedure. Ask your doctor when your results will be ready.  Keep all follow-up visits as told by your doctor. This is important. Contact a doctor if:  You have very bad cramps that get worse or do not get better with medicine.  You have very bad pain in your belly (abdomen).  You cannot drink fluids without throwing up (vomiting).  You get pain in a different part of the area between your belly and  thighs (pelvis).  You have bad-smelling discharge from your vagina.  You have a rash. Get help right away if:  You are bleeding a lot from your vagina. A lot of bleeding means soaking more than one sanitary pad in an hour, for 2 hours in a row.  You have clumps of blood (blood clots) coming from your vagina.  You have a fever or chills.  Your belly feels very tender or hard.  You have chest pain.  You have trouble breathing.  You cough up blood.  You feel dizzy.  You feel light-headed.  You pass out (faint).  You have pain in your neck or shoulder area. Summary  Take short walks often, followed by rest periods. Ask your doctor what activities are  safe for you. After one or two days, you may be able to return to your normal activities.  Do not lift anything that is heavier than 10 lb (4.5 kg) until your doctor approves.  Do not take baths, swim, or use a hot tub until your doctor approves. Take showers instead of baths.  Contact your doctor if you have any symptoms of infection, like bad-smelling discharge from your vagina. This information is not intended to replace advice given to you by your health care provider. Make sure you discuss any questions you have with your health care provider.   Post Anesthesia Home Care Instructions  Activity: Get plenty of rest for the remainder of the day. A responsible individual must stay with you for 24 hours following the procedure.  For the next 24 hours, DO NOT: -Drive a car -Advertising copywriterperate machinery -Drink alcoholic beverages -Take any medication unless instructed by your physician -Make any legal decisions or sign important papers.  Meals: Start with liquid foods such as gelatin or soup. Progress to regular foods as tolerated. Avoid greasy, spicy, heavy foods. If nausea and/or vomiting occur, drink only clear liquids until the nausea and/or vomiting subsides. Call your physician if vomiting continues.  Special Instructions/Symptoms: Your throat may feel dry or sore from the anesthesia or the breathing tube placed in your throat during surgery. If this causes discomfort, gargle with warm salt water. The discomfort should disappear within 24 hours.  If you had a scopolamine patch placed behind your ear for the management of post- operative nausea and/or vomiting:  1. The medication in the patch is effective for 72 hours, after which it should be removed.  Wrap patch in a tissue and discard in the trash. Wash hands thoroughly with soap and water. 2. You may remove the patch earlier than 72 hours if you experience unpleasant side effects which may include dry mouth, dizziness or visual  disturbances. 3. Avoid touching the patch. Wash your hands with soap and water after contact with the patch.     Document Released: 01/21/2008 Document Revised: 03/26/2017 Document Reviewed: 12/30/2015 Elsevier Patient Education  2020 [employer]

## 2018-11-16 NOTE — Op Note (Signed)
Alexis Ali PROCEDURE DATE:  11/16/2018  PREOPERATIVE DIAGNOSIS: Intrauterine fetal demise at [redacted]w[redacted]d POSTOPERATIVE DIAGNOSIS: The same PROCEDURE:  Dilation and Evacuation under ultrasound guidance SURGEON:  Dr. Verita Schneiders  INDICATIONS: 33 y.o.  G2P1001 with intrauterine fetal demise at [redacted]w[redacted]d, who desires surgical management.  Risks of surgery were discussed with the patient including but not limited to: bleeding which may require transfusion; infection which may require antibiotics; injury to uterus or surrounding organs; need for additional procedures including laparotomy or laparoscopy; possibility of intrauterine scarring which may impair future fertility; and other postoperative/anesthesia complications. Written informed consent was obtained.  FINDINGS:   Intrauterine fetal demise at 100w5d, very macerated and brown tissue. Significant amount of products of conception.  Empty endometrial stripe noted on ultrasound at the end of the procedure.   ANESTHESIA:    General and paracervical block with 30 ml of 0.5% Marcaine ESTIMATED BLOOD LOSS:  50 ml. SPECIMENS:  Products of conception sent to pathology. Some products (one extremity) also sent for microarray analysis Elliot Gault) COMPLICATIONS:  None immediate.  PROCEDURE DETAILS:  The patient received intravenous Doxycycline while in the preoperative area.  She was then taken to the operating room where anesthesia  was administered and was found to be adequate.  After an adequate timeout was performed, she was placed in the dorsal lithotomy position and examined; then prepped and draped in the sterile manner.   Her bladder was catheterized for an unmeasured amount of clear, yellow urine. A vaginal speculum was then placed in the patient's vagina and a single tooth tenaculum was applied to the anterior lip of the cervix.  A paracervical block using 30 ml of 0.5% Marcaine was administered. The cervix was gently dilated under ultrasound guidance to  accommodate a 16 mm suction curette that was gently advanced to the uterine fundus.  The suction device was then activated and curette slowly rotated to clear the uterus of products of conception.  A sharp curettage was then performed to confirm complete emptying of the uterus. There was an empty endometrial stripe noted on the ultrasound at the end of the curettage. Methergine 0.2 mg IM was administered to help prevent bleeding. There was minimal bleeding noted at the end of the procedure, and the tenaculum removed with good hemostasis noted.   All instruments were removed from the patient's vagina.  Sponge and instrument counts were correct times three.    The patient tolerated the procedure well and was taken to the recovery area awake, extubated and in stable condition.  The patient will be discharged to home as per PACU criteria.  Routine postoperative instructions given.  She was prescribed Percocet, Ibuprofen and Colace.  She will follow up in the office in 2-3 weeks for postoperative evaluation.   Verita Schneiders, MD, [place] [facility], [place]

## 2018-11-16 NOTE — H&P (Signed)
Preoperative History and Physical  Alexis Ali is a 33 y.o. G2P1001 here for surgical management of intrauterine ftal demise measuring about [redacted]w[redacted]d  History of Type II DM, demise was diagnosed during prenatal visit on 11/14/18.  She was counseled by Dr. SDonalee Citrin(MFM)about her options, she opted for Dilation and Evacuation. MFM also recommended microarray analysis (Elliot Gault of the fetal tissue.   No significant preoperative concerns noted during encounter today.  Proposed surgery: Dilation and Evacuation under Ultrasound Guidance.  Past Medical History:  Diagnosis Date  . Diabetes mellitus without complication ([facility]    Past Surgical History:  Procedure Laterality Date  . NO PAST SURGERIES     OB History  Gravida Para Term Preterm AB Living  _0 SAB TAB Ectopic Multiple Live Births          1    # Outcome Date GA Lbr Len/2nd Weight Sex Delivery Anes PTL Lv  2 Current           1 Term 06/19/06 349w0d M Vag-Spont   LIV  Patient denies any other pertinent gynecologic issues.   No current facility-administered medications on file prior to encounter.    Current Outpatient Medications on File Prior to Encounter  Medication Sig Dispense Refill  . aspirin EC 81 MG tablet Take 1 tablet (81 mg total) by mouth daily. Take after 12 weeks for prevention of preeclampsia later in pregnancy 300 tablet 2  . cephALEXin (KEFLEX) 500 MG capsule Take 1 capsule (500 mg total) by mouth 4 (four) times daily. 28 capsule 2  . Accu-Chek FastClix Lancets MISC 1 Units by Percutaneous route 4 (four) times daily. 100 each 12  . Blood Glucose Monitoring Suppl (ACCU-CHEK GUIDE) w/Device KIT 1 Device by Does not apply route 4 (four) times daily. 1 kit 0  . Blood Pressure Monitor KIT 1 kit by Does not apply route once a week. 1 kit 0  . Doxylamine-Pyridoxine 10-10 MG TBEC Take 2 tabs at bedtime. If symptoms persist after 2 days, take 1 tab in the morning and 2 tabs at bedtime 100 tablet 6  . glucose blood  (ACCU-CHEK GUIDE) test strip Use to check blood sugars four times a day was instructed 50 each 12  . metroNIDAZOLE (FLAGYL) 500 MG tablet Take 1 tablet (500 mg total) by mouth 2 (two) times daily. (Patient not taking: Reported on 11/14/2018) 14 tablet 0  . Prenatal Vit-Fe Phos-FA-Omega (VITAFOL GUMMIES) 3.33-0.333-34.8 MG CHEW Chew 3 each by mouth daily. 90 tablet 11   No Known Allergies  Social History:   reports that she has never smoked. She has never used smokeless tobacco. She reports previous alcohol use. She reports that she does not use drugs.  History reviewed. No pertinent family history.  Review of Systems: Pertinent items noted in HPI and remainder of comprehensive ROS otherwise negative.  PHYSICAL EXAM: Blood pressure 111/61, pulse 63, temperature 98.2 F (36.8 C), temperature source Oral, resp. rate 20, height _1  (1.549 m), weight 109.7 kg, last menstrual period 07/18/2018, SpO2 100 %. CONSTITUTIONAL: Well-developed, well-nourished female in no acute distress.  HENT:  Normocephalic, atraumatic, External right and left ear normal. Oropharynx is clear and moist EYES: Conjunctivae and EOM are normal. Pupils are equal, round, and reactive to light. No scleral icterus.  NECK: Normal range of motion, supple, no masses SKIN: Skin is warm and dry. No rash noted. Not diaphoretic. No erythema. No pallor. NEUROLOGIC: Alert and oriented  to person, place, and time. Normal reflexes, muscle tone coordination. No cranial nerve deficit noted. PSYCHIATRIC: Normal mood and affect. Normal behavior. Normal judgment and thought content. CARDIOVASCULAR: Normal heart rate noted, regular rhythm RESPIRATORY: Effort and breath sounds normal, no problems with respiration noted ABDOMEN: Soft, nontender, nondistended. PELVIC: Deferred MUSCULOSKELETAL: Normal range of motion. No edema and no tenderness. 2+ distal pulses.  Labs: Results for orders placed or performed during the hospital encounter of  11/16/18 (from the past 336 hour(s))  Glucose, capillary   Collection Time: 11/16/18  9:55 AM  Result Value Ref Range   Glucose-Capillary 82 70 - 99 mg/dL  Results for orders placed or performed during the hospital encounter of 11/15/18 (from the past 336 hour(s))  SARS Coronavirus 2 (Performed in [place] hospital lab)   Collection Time: 11/15/18  1:22 PM   Specimen: Nasal Swab  Result Value Ref Range   SARS Coronavirus 2 NEGATIVE NEGATIVE    Imaging Studies: [place] Mfm Ob Limited  Result Date: 11/14/2018 ----------------------------------------------------------------------  OBSTETRICS REPORT                       (Signed Final 11/14/2018 12:37 pm) ---------------------------------------------------------------------- Patient Info  ID #:       [id number]                          D.O.B.:  10-18-1985 (33 yrs)  Name:       Alexis Ali                    Visit Date: 11/14/2018 11:46 am ---------------------------------------------------------------------- Performed By  Performed By:     Enriqueta Shutter           Ref. Address:     [address], [place]                                                             [place]                                                             [place]  Attending:        Tama High MD  [facility]                                                             [place]  Referred By:      [place] ---------------------------------------------------------------------- Orders   #  Description                          Code         Ordered By   1  [place] MFM OB LIMITED                    [id number]     [place]  ----------------------------------------------------------------------   #  Order #                    Accession #                 Episode #   1  [id number]                   [id number]                  [id number]  ---------------------------------------------------------------------- Indications   Pregnancy with inconclusive fetal viability    O36.80X0   Unable to hear fetal heart tones as reason     O76   for ultrasound   Gestational diabetes in pregnancy, diet        O24.410   controlled   Maternal thalassemia complicating              O99.012   pregnancy in second trimester (Alpha silent   carrier-aa/a-)   Genetic carrier (monoallelic mutation of       Z14.8   SMN1 gene)   [redacted] weeks gestation of pregnancy                Z3A.17  ---------------------------------------------------------------------- Fetal Evaluation  Num Of Fetuses:         1  Cardiac Activity:       Absent  Fetal Lie:              Transverse  Placenta:               Posterior  Amniotic Fluid  AFI FV:      Within normal limits ---------------------------------------------------------------------- Biometry  FL:         16  mm     G. Age:  14w 5d        < 1  % ---------------------------------------------------------------------- OB History  Gravidity:    2         Term:   1  Living:       1 ---------------------------------------------------------------------- Gestational Age  LMP:           17w 0d        Date:  07/18/18                 EDD:   04/24/19  U/S Today:     14w 5d  EDD:   05/10/19  Best:          Isaac Bliss 0d     Det. By:  LMP  (07/18/18)          EDD:   04/24/19 ---------------------------------------------------------------------- Impression  Ms. Lehmkuhl, G2 P1 at 17-weeks' gestation, is here for  ultrasound evaluation. Fetal heart tone was not detected on  your office examination.  Patient has type 2 diabetes and has just started checking her  blood glucose.  A limited ultrasound study was performed. Amniotic fluid is  normal. Unfortunately, no fetal heart activity is seen. Femur  length measurement is consistent with 14w 5d.  I explained the findings and the possible causes  including  diabetes or chromosomal anomalies. I discussed the option  of D&E (she will not be able to see an intact fetus) or  misoprostol induction. Patient opted for D&E. I also  discussed amniocentesis for fetal karyotype and microarray.  Alternatively, the fetal tissue can be sent for microarray  Elliot Gault). Patient opted not to have amniocentesis.  Informed Dr. Roselie Awkward, who will be making OR arrangements. ---------------------------------------------------------------------- Recommendations  -D&E.  -Fetal tissue to be sent for microarray analysis Elliot Gault). 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  Microarray analysis will be done on fetal tissue.  Risks of surgery were discussed with the patient including but not limited to: bleeding which may require transfusion; infection which may require antibiotics; injury to uterus or surrounding organs; need for additional procedures including laparotomy or laparoscopy; possibility of intrauterine scarring which may impair future fertility; and other postoperative/anesthesia complications.  Likelihood of success in alleviating the patient's condition was discussed. Routine postoperative instructions will be reviewed with the patient and her family in detail after surgery.  The patient concurred with the proposed plan, giving informed written consent for the surgery.   Patient has been NPO since last night and she will remain NPO for procedure.  Anesthesia and OR aware.  Preoperative prophylactic antibiotics and SCDs ordered on call to the OR.  To OR when ready.   Verita Schneiders, MD, [place] for [employer], [place]

## 2018-11-16 NOTE — Transfer of Care (Signed)
Immediate Anesthesia Transfer of Care Note  Patient: Alexis Ali  Procedure(s) Performed: DILATATION AND EVACUATION (N/A Vagina ) OPERATIVE ULTRASOUND (N/A Vagina )  Patient Location: PACU  Anesthesia Type:General  Level of Consciousness: drowsy  Airway & Oxygen Therapy: Patient Spontanous Breathing and Patient connected to face mask oxygen  Post-op Assessment: Report given to RN and Post -op Vital signs reviewed and stable  Post vital signs: Reviewed and stable  Last Vitals:  Vitals Value Taken Time  BP    Temp    Pulse 75 11/16/18 1142  Resp 28 11/16/18 1142  SpO2 100 % 11/16/18 1142  Vitals shown include unvalidated device data.  Last Pain:  Vitals:   11/16/18 0940  TempSrc: Oral  PainSc: 0-No pain         Complications: No apparent anesthesia complications

## 2018-11-17 ENCOUNTER — Encounter (HOSPITAL_BASED_OUTPATIENT_CLINIC_OR_DEPARTMENT_OTHER): Payer: Self-pay | Admitting: Obstetrics & Gynecology

## 2018-11-23 ENCOUNTER — Encounter: Payer: Self-pay | Admitting: Obstetrics & Gynecology

## 2018-11-25 DIAGNOSIS — E1165 Type 2 diabetes mellitus with hyperglycemia: Secondary | ICD-10-CM | POA: Diagnosis not present

## 2018-11-25 DIAGNOSIS — H538 Other visual disturbances: Secondary | ICD-10-CM | POA: Diagnosis not present

## 2018-11-30 ENCOUNTER — Ambulatory Visit: Payer: Medicaid Other | Admitting: Skilled Nursing Facility1

## 2018-12-01 ENCOUNTER — Other Ambulatory Visit: Payer: Medicaid Other

## 2018-12-02 ENCOUNTER — Other Ambulatory Visit (HOSPITAL_COMMUNITY): Payer: Medicaid Other

## 2018-12-02 ENCOUNTER — Ambulatory Visit (HOSPITAL_COMMUNITY): Payer: Medicaid Other

## 2018-12-09 ENCOUNTER — Encounter: Payer: Self-pay | Admitting: Obstetrics and Gynecology

## 2018-12-09 ENCOUNTER — Ambulatory Visit (INDEPENDENT_AMBULATORY_CARE_PROVIDER_SITE_OTHER): Payer: Medicaid Other | Admitting: Obstetrics and Gynecology

## 2018-12-09 ENCOUNTER — Other Ambulatory Visit: Payer: Self-pay

## 2018-12-09 VITALS — BP 120/79 | HR 68 | Wt 243.4 lb

## 2018-12-09 DIAGNOSIS — F322 Major depressive disorder, single episode, severe without psychotic features: Secondary | ICD-10-CM

## 2018-12-09 DIAGNOSIS — Z9889 Other specified postprocedural states: Secondary | ICD-10-CM

## 2018-12-09 MED ORDER — SERTRALINE HCL 50 MG PO TABS: 50.0000 mg | ORAL_TABLET | Freq: Every day | ORAL | 6 refills | Status: DC

## 2018-12-09 NOTE — Progress Notes (Signed)
33 yo here for post op check s/p D&E on 7/22 due to a 14 week loss. Patient reports doing well. She denies any pelvic pain or vaginal bleeding. Emotionally she is still coping with the loss reporting a good support system at home. She scored 15 on EPDS. She denies suicidal or homicidal ideations  Past Medical History:  Diagnosis Date  . Diabetes mellitus without complication [facility])    Past Surgical History:  Procedure Laterality Date  . DILATION AND EVACUATION N/A 11/16/2018   Procedure: DILATATION AND EVACUATION;  Surgeon: Osborne Oman, MD;  Location: [place];  Service: Gynecology;  Laterality: N/A;  . NO PAST SURGERIES    . OPERATIVE ULTRASOUND N/A 11/16/2018   Procedure: OPERATIVE ULTRASOUND;  Surgeon: Osborne Oman, MD;  Location: [place];  Service: Gynecology;  Laterality: N/A;   No family history on file. Social History   Tobacco Use  . Smoking status: Never Smoker  . Smokeless tobacco: Never Used  Substance Use Topics  . Alcohol use: Not Currently    Frequency: Never  . Drug use: Never   ROS See pertinent in HPI  Blood pressure 120/79, pulse 68, weight 243 lb 6.4 oz (110.4 kg), last menstrual period 07/18/2018. GENERAL: Well-developed, well-nourished female in no acute distress.  ABDOMEN: Soft, nontender, nondistended. No organomegaly. EXTREMITIES: No cyanosis, clubbing, or edema, 2+ distal pulses.  A/P 33 yo s/p D&E for 14 week loss here for post op check with depression - Patient desires to conceive in the near future. She declines contraception at this time - Patient advised to continue prenatal vitamins - Discussed benefits of antidepressant during this stage of healing- Patient is undecided but agreed to receive the prescription and will start medication if no improvement in symptoms - patient agreed to meet with integrated behavioral health - RTC prn

## 2018-12-09 NOTE — Progress Notes (Signed)
Pt is here for Post Op D&E on 11/16/18. EPDS: 15

## 2018-12-15 ENCOUNTER — Telehealth: Payer: Self-pay | Admitting: Obstetrics and Gynecology

## 2018-12-15 ENCOUNTER — Telehealth: Payer: Medicaid Other | Admitting: Clinical

## 2018-12-15 ENCOUNTER — Other Ambulatory Visit: Payer: Self-pay

## 2018-12-15 DIAGNOSIS — Z91199 Patient's noncompliance with other medical treatment and regimen due to unspecified reason: Secondary | ICD-10-CM

## 2018-12-15 DIAGNOSIS — Z5329 Procedure and treatment not carried out because of patient's decision for other reasons: Secondary | ICD-10-CM

## 2018-12-15 NOTE — BH Specialist Note (Signed)
Pt did not arrive to video visit and did not answer the phone; Left HIPPA-compliant message to call back Roselyn Reef from [facility] [employer] [facility] at [phone], and left MyChart message for patient.   [place] via Telemedicine Video Visit  12/15/2018 [place] [phone]  Garlan Fair

## 2018-12-26 NOTE — BH Specialist Note (Addendum)
Integrated Behavioral Health via Telemedicine Video Visit  Telephone  12/26/2018 Alexis Ali [id number]  Number of Integrated Behavioral Health visits: 1 Session Start time: 8:17  Session End time: 9:03 Total time: 45 minutes  Referring Provider: Jaynie CollinsUgonna Anyanwu, MD Type of Visit: Telephone Patient/Family location: Home  [facility] Provider location: [facility] All persons participating in visit: Patient [facility]is [facility] and [facility] [name]  Confirmed patient's address: Yes  Confirmed patient's phone number: Yes  Any changes to demographics: No   Confirmed patient's insurance: Yes  Any changes to patient's insurance: No   Discussed confidentiality: Yes   I connected with Alexis Ali by a telephone enabled telemedicine application and verified that I am speaking with the correct person using two identifiers.     I discussed the limitations of evaluation and management by telemedicine and the availability of in person appointments.  I discussed that the purpose of this visit is to provide behavioral health care while limiting exposure to the novel coronavirus.   Discussed there is a possibility of technology failure and discussed alternative modes of communication if that failure occurs.  I discussed that engaging in this telephone visit, they consent to the provision of behavioral healthcare and the services will be billed under their insurance.  Patient and/or legal guardian expressed understanding and consented to telephone visit: Yes   PRESENTING CONCERNS: Patient and/or family reports the following symptoms/concerns: Pt states her primary daily symptom is worry, and feeling nervous about future pregnancies, after her loss, low appetite, holds her feelings in, and doesn't know how to get on MyChart. Pt is not taking BH medication. Pt is open to learning a self-coping strategy today.  Duration of problem: about 5 weeks Severity of problem: mild  STRENGTHS (Protective Factors/Coping  Skills): Supportive family  GOALS ADDRESSED: Patient will: 1.  Reduce symptoms of: anxiety and depression  2.  Increase knowledge and/or ability of: coping skills and healthy habits  3.  Demonstrate ability to: Increase healthy adjustment to current life circumstances, Increase motivation to adhere to plan of care and Begin healthy grieving over loss  INTERVENTIONS: Interventions utilized:  Brief CBT and Psychoeducation and/or Health Education Standardized Assessments completed: GAD-7 and PHQ 9  ASSESSMENT: Patient currently experiencing Adjustment disorder with mixed anxious and depressed mood.   Patient may benefit from psychoeducation and brief therapeutic interventions regarding coping with symptoms of anxiety and depression. Marland Kitchen.  PLAN: 1. Follow up with behavioral health clinician on : One week 2. Behavioral recommendations:  -Begin Worry Time strategy, starting today; daily for one week -Continue taking prenatal vitamins until bottle runs out 3. Referral(s): [facility]  I discussed the assessment and treatment plan with the patient and/or parent/guardian. They were provided an opportunity to ask questions and all were answered. They agreed with the plan and demonstrated an understanding of the instructions.   They were advised to call back or seek an in-person evaluation if the symptoms worsen or if the condition fails to improve as anticipated.  Valetta CloseJamie C Codie Hainer   Depression screen [facility] 12/27/2018  Decreased Interest 1  Down, Depressed, Hopeless 2  PHQ - 2 Score 3  Altered sleeping 1  Tired, decreased energy 1  Change in appetite 1  Feeling bad or failure about yourself  1  Trouble concentrating 1  Moving slowly or fidgety/restless 1  Suicidal thoughts 0  PHQ-9 Score 9   GAD 7 : Generalized Anxiety Score 12/27/2018  Nervous, Anxious, on Edge 1  Control/stop worrying 3  Worry too much - different things 1  Trouble relaxing 1   Restless 0  Easily annoyed or irritable 1  Afraid - awful might happen 2  Total GAD 7 Score 9

## 2018-12-27 ENCOUNTER — Telehealth (INDEPENDENT_AMBULATORY_CARE_PROVIDER_SITE_OTHER): Payer: Medicaid Other | Admitting: Clinical

## 2018-12-27 ENCOUNTER — Other Ambulatory Visit: Payer: Self-pay

## 2018-12-27 DIAGNOSIS — F4323 Adjustment disorder with mixed anxiety and depressed mood: Secondary | ICD-10-CM

## 2018-12-29 NOTE — Telephone Encounter (Signed)
Opened in error

## 2019-01-03 NOTE — BH Specialist Note (Signed)
Pt did not arrive to video visit, and did answer the phone at original visit time of 9:45am; pt said she was on the bus, and agreed to reschedule for 11:15am today. At 11:15am visit time, pt did not arrive to video visit and did not answer the phone; Left HIPPA-compliant message to call back Roselyn Reef from [facility] at [phone], and MyChart message left for patient.   [place] via Telemedicine Video Visit  01/03/2019 [place] [phone]  Garlan Fair

## 2019-01-04 ENCOUNTER — Telehealth: Payer: Medicaid Other | Admitting: Clinical

## 2019-01-04 ENCOUNTER — Other Ambulatory Visit: Payer: Self-pay

## 2019-01-04 DIAGNOSIS — Z5329 Procedure and treatment not carried out because of patient's decision for other reasons: Secondary | ICD-10-CM

## 2019-01-04 DIAGNOSIS — Z91199 Patient's noncompliance with other medical treatment and regimen due to unspecified reason: Secondary | ICD-10-CM

## 2019-02-01 DIAGNOSIS — Z5181 Encounter for therapeutic drug level monitoring: Secondary | ICD-10-CM | POA: Diagnosis not present

## 2019-02-16 DIAGNOSIS — Z5181 Encounter for therapeutic drug level monitoring: Secondary | ICD-10-CM | POA: Diagnosis not present

## 2019-02-21 DIAGNOSIS — Z5181 Encounter for therapeutic drug level monitoring: Secondary | ICD-10-CM | POA: Diagnosis not present

## 2019-02-23 ENCOUNTER — Ambulatory Visit (INDEPENDENT_AMBULATORY_CARE_PROVIDER_SITE_OTHER): Payer: Medicaid Other

## 2019-02-23 ENCOUNTER — Other Ambulatory Visit: Payer: Self-pay

## 2019-02-23 DIAGNOSIS — Z3202 Encounter for pregnancy test, result negative: Secondary | ICD-10-CM | POA: Diagnosis not present

## 2019-02-23 LAB — POCT URINE PREGNANCY: Preg Test, Ur: NEGATIVE

## 2019-02-23 NOTE — Progress Notes (Signed)
Pt is here for pregnancy test.  In office negative.  Pt reports not missing a period but that it was short only for 3 days. Spoke with Dr. Jodi Mourning.  He suggest that the pt come back for a repeat UPT if she misses her period next month. Pt verbalized understanding

## 2019-03-02 DIAGNOSIS — Z5181 Encounter for therapeutic drug level monitoring: Secondary | ICD-10-CM | POA: Diagnosis not present

## 2019-03-08 DIAGNOSIS — Z5181 Encounter for therapeutic drug level monitoring: Secondary | ICD-10-CM | POA: Diagnosis not present

## 2019-03-15 DIAGNOSIS — Z5181 Encounter for therapeutic drug level monitoring: Secondary | ICD-10-CM | POA: Diagnosis not present

## 2019-03-21 DIAGNOSIS — Z5181 Encounter for therapeutic drug level monitoring: Secondary | ICD-10-CM | POA: Diagnosis not present

## 2019-04-04 DIAGNOSIS — Z5181 Encounter for therapeutic drug level monitoring: Secondary | ICD-10-CM | POA: Diagnosis not present

## 2019-04-11 DIAGNOSIS — Z5181 Encounter for therapeutic drug level monitoring: Secondary | ICD-10-CM | POA: Diagnosis not present

## 2019-05-09 DIAGNOSIS — Z5181 Encounter for therapeutic drug level monitoring: Secondary | ICD-10-CM | POA: Diagnosis not present

## 2019-05-11 ENCOUNTER — Ambulatory Visit (INDEPENDENT_AMBULATORY_CARE_PROVIDER_SITE_OTHER): Payer: Medicaid Other | Admitting: Obstetrics and Gynecology

## 2019-05-11 ENCOUNTER — Encounter: Payer: Self-pay | Admitting: *Deleted

## 2019-05-11 ENCOUNTER — Telehealth: Payer: Self-pay | Admitting: *Deleted

## 2019-05-11 ENCOUNTER — Encounter: Payer: Self-pay | Admitting: Obstetrics and Gynecology

## 2019-05-11 ENCOUNTER — Other Ambulatory Visit: Payer: Self-pay

## 2019-05-11 VITALS — BP 126/83 | HR 64 | Wt 230.0 lb

## 2019-05-11 DIAGNOSIS — Z3200 Encounter for pregnancy test, result unknown: Secondary | ICD-10-CM

## 2019-05-11 DIAGNOSIS — Z3202 Encounter for pregnancy test, result negative: Secondary | ICD-10-CM | POA: Diagnosis not present

## 2019-05-11 DIAGNOSIS — R1010 Upper abdominal pain, unspecified: Secondary | ICD-10-CM

## 2019-05-11 NOTE — Progress Notes (Signed)
GYNECOLOGY OFFICE FOLLOW UP NOTE  History:  34 y.o. G2P1001 here today for follow up for SAB. Had D&C for missed ab 10/2018. She is here today for a "checkup" because she is worried about what she is feeling is normal. Has been having monthly periods since D&C. LMP 04/30/19. Lasting 4-5 days, no longer than a week. Starts heavy and then gets lighter. This is how her periods have been, nothing abnormal. She is concerned she may be pregnant because she feels weird. Took home pregnancy test 2 days ago and it did not show any reading negative or positive. The lines didn't work.   She is worried because she just feels "weird". Sometimes feels pain, sometimes has a knot on her left side. Pain starts at top left quadrant and sometimes goes down to bottom right. Feels like a "knot."Comes and goes, doesn't happen every night, happens 2 or 3 nights per week. Has not correlated to BM. Has daily BM and they are regularly formed.    Denies headache, fever, chills, nausea, vomiting, constipation.   Past Medical History:  Diagnosis Date  . Diabetes mellitus without complication [facility])     Past Surgical History:  Procedure Laterality Date  . DILATION AND EVACUATION N/A 11/16/2018   Procedure: DILATATION AND EVACUATION;  Surgeon: Osborne Oman, MD;  Location: [place];  Service: Gynecology;  Laterality: N/A;  . NO PAST SURGERIES    . OPERATIVE ULTRASOUND N/A 11/16/2018   Procedure: OPERATIVE ULTRASOUND;  Surgeon: Osborne Oman, MD;  Location: [place];  Service: Gynecology;  Laterality: N/A;     Current Outpatient Medications:  .  Accu-Chek FastClix Lancets MISC, 1 Units by Percutaneous route 4 (four) times daily. (Patient not taking: Reported on 12/09/2018), Disp: 100 each, Rfl: 12 .  Blood Glucose Monitoring Suppl (ACCU-CHEK GUIDE) w/Device KIT, 1 Device by Does not apply route 4 (four) times daily. (Patient not taking: Reported on 12/09/2018), Disp: 1 kit, Rfl: 0 .   Blood Pressure Monitor KIT, 1 kit by Does not apply route once a week. (Patient not taking: Reported on 12/09/2018), Disp: 1 kit, Rfl: 0 .  docusate sodium (COLACE) 100 MG capsule, Take 1 capsule (100 mg total) by mouth 2 (two) times daily as needed for mild constipation or moderate constipation. (Patient not taking: Reported on 12/09/2018), Disp: 30 capsule, Rfl: 2 .  glucose blood (ACCU-CHEK GUIDE) test strip, Use to check blood sugars four times a day was instructed (Patient not taking: Reported on 12/09/2018), Disp: 50 each, Rfl: 12 .  ibuprofen (ADVIL) 600 MG tablet, Take 1 tablet (600 mg total) by mouth every 6 (six) hours as needed for headache, mild pain, moderate pain or cramping. (Patient not taking: Reported on 12/09/2018), Disp: 30 tablet, Rfl: 2 .  oxyCODONE-acetaminophen (PERCOCET/ROXICET) 5-325 MG tablet, Take 1 tablet by mouth every 4 (four) hours as needed for severe pain., Disp: 15 tablet, Rfl: 0 .  sertraline (ZOLOFT) 50 MG tablet, Take 1 tablet (50 mg total) by mouth daily., Disp: 30 tablet, Rfl: 6  Current Facility-Administered Medications:  .  doxycycline (VIBRAMYCIN) 200 mg in dextrose 5 % 250 mL IVPB, 200 mg, Intravenous, Once, Anyanwu, Ugonna A, MD  The following portions of the patient's history were reviewed and updated as appropriate: allergies, current medications, past family history, past medical history, past social history, past surgical history and problem list.   Review of Systems:  Pertinent items noted in HPI and remainder of comprehensive ROS otherwise negative.  Objective:  Physical Exam BP 126/83   Pulse 64   Wt 230 lb (104.3 kg)   LMP 04/28/2019   BMI 43.46 kg/m  CONSTITUTIONAL: Well-developed, well-nourished female in no acute distress.  HENT:  Normocephalic, atraumatic. External right and left ear normal. Oropharynx is clear and moist EYES: Conjunctivae and EOM are normal. Pupils are equal, round, and reactive to light. No scleral icterus.  NECK:  Normal range of motion, supple, no masses SKIN: Skin is warm and dry. No rash noted. Not diaphoretic. No erythema. No pallor. NEUROLOGIC: Alert and oriented to person, place, and time. Normal reflexes, muscle tone coordination. No cranial nerve deficit noted. PSYCHIATRIC: Normal mood and affect. Normal behavior. Normal judgment and thought content. CARDIOVASCULAR: Normal heart rate noted RESPIRATORY: Effort normal, no problems with respiration noted ABDOMEN: Soft, no distention noted.  midl tenderness with LUQ and RUQ palpation PELVIC: deferred MUSCULOSKELETAL: Normal range of motion. No edema noted.   Assessment & Plan:   1. Pain of upper abdomen - Not related to menses, occurs 2-3x weekly and only at night -suspect GI in nature - advised patient to start metamucil/benefiber supplement - refer to PCP - hCG, quantitative, pregnancy  2. Possible pregnancy UPT and HCG today   Routine preventative health maintenance measures emphasized. Please refer to After Visit Summary for other counseling recommendations.   Return in about 3 months (around 08/09/2019), or if symptoms worsen or fail to improve.  Total face-to-face time with patient: 22 minutes. Over 50% of encounter was spent on counseling and coordination of care.  Feliz Beam, M.D. Attending [facility] [employer])

## 2019-05-11 NOTE — Telephone Encounter (Signed)
Please call patient with results from today she stated at checkout she no longer has access to Mychart for results.Marland KitchenMarland Kitchen

## 2019-05-12 LAB — BETA HCG QUANT (REF LAB): hCG Quant: <1 m[IU]/mL

## 2019-05-15 DIAGNOSIS — Z5181 Encounter for therapeutic drug level monitoring: Secondary | ICD-10-CM | POA: Diagnosis not present

## 2019-05-22 DIAGNOSIS — Z5181 Encounter for therapeutic drug level monitoring: Secondary | ICD-10-CM | POA: Diagnosis not present

## 2019-05-30 DIAGNOSIS — Z5181 Encounter for therapeutic drug level monitoring: Secondary | ICD-10-CM | POA: Diagnosis not present

## 2019-06-05 DIAGNOSIS — Z5181 Encounter for therapeutic drug level monitoring: Secondary | ICD-10-CM | POA: Diagnosis not present

## 2019-06-13 DIAGNOSIS — Z5181 Encounter for therapeutic drug level monitoring: Secondary | ICD-10-CM | POA: Diagnosis not present

## 2019-06-23 DIAGNOSIS — Z1159 Encounter for screening for other viral diseases: Secondary | ICD-10-CM | POA: Diagnosis not present

## 2019-06-23 DIAGNOSIS — Z03818 Encounter for observation for suspected exposure to other biological agents ruled out: Secondary | ICD-10-CM | POA: Diagnosis not present

## 2019-06-23 DIAGNOSIS — Z20828 Contact with and (suspected) exposure to other viral communicable diseases: Secondary | ICD-10-CM | POA: Diagnosis not present

## 2019-06-26 DIAGNOSIS — Z20828 Contact with and (suspected) exposure to other viral communicable diseases: Secondary | ICD-10-CM | POA: Diagnosis not present

## 2019-06-26 DIAGNOSIS — Z1159 Encounter for screening for other viral diseases: Secondary | ICD-10-CM | POA: Diagnosis not present

## 2019-06-26 DIAGNOSIS — Z03818 Encounter for observation for suspected exposure to other biological agents ruled out: Secondary | ICD-10-CM | POA: Diagnosis not present

## 2019-07-03 DIAGNOSIS — Z1159 Encounter for screening for other viral diseases: Secondary | ICD-10-CM | POA: Diagnosis not present

## 2019-07-03 DIAGNOSIS — Z03818 Encounter for observation for suspected exposure to other biological agents ruled out: Secondary | ICD-10-CM | POA: Diagnosis not present

## 2019-07-05 DIAGNOSIS — Z20828 Contact with and (suspected) exposure to other viral communicable diseases: Secondary | ICD-10-CM | POA: Diagnosis not present

## 2019-07-05 DIAGNOSIS — R0981 Nasal congestion: Secondary | ICD-10-CM | POA: Diagnosis not present

## 2019-07-05 DIAGNOSIS — Z7189 Other specified counseling: Secondary | ICD-10-CM | POA: Diagnosis not present

## 2019-07-25 ENCOUNTER — Encounter: Payer: Self-pay | Admitting: Internal Medicine

## 2019-07-25 ENCOUNTER — Encounter (HOSPITAL_COMMUNITY): Payer: Self-pay

## 2019-07-25 ENCOUNTER — Ambulatory Visit (INDEPENDENT_AMBULATORY_CARE_PROVIDER_SITE_OTHER): Payer: Medicaid Other | Admitting: Internal Medicine

## 2019-07-25 ENCOUNTER — Ambulatory Visit (HOSPITAL_COMMUNITY)
Admission: EM | Admit: 2019-07-25 | Discharge: 2019-07-25 | Disposition: A | Discharge status: Home / Self Care | Payer: Medicaid Other | Attending: Family Medicine | Admitting: Family Medicine

## 2019-07-25 ENCOUNTER — Other Ambulatory Visit: Payer: Self-pay

## 2019-07-25 DIAGNOSIS — Z3A01 Less than 8 weeks gestation of pregnancy: Secondary | ICD-10-CM

## 2019-07-25 DIAGNOSIS — Z3201 Encounter for pregnancy test, result positive: Secondary | ICD-10-CM

## 2019-07-25 DIAGNOSIS — Z7689 Persons encountering health services in other specified circumstances: Secondary | ICD-10-CM | POA: Diagnosis not present

## 2019-07-25 LAB — POC URINE PREG, ED
Preg Test, Ur: POSITIVE — AB
Preg Test, Ur: POSITIVE — AB

## 2019-07-25 LAB — POCT PREGNANCY, URINE: Preg Test, Ur: POSITIVE — AB

## 2019-07-25 MED ORDER — PRENATAL VITAMINS 28-0.8 MG PO TABS: 1.0000 | ORAL_TABLET | Freq: Every day | ORAL | 11 refills | Status: DC

## 2019-07-25 NOTE — Discharge Instructions (Addendum)
Pregnancy test is positive  Keep taking your prenatals.  Follow up with [facility] for further care.

## 2019-07-25 NOTE — Progress Notes (Signed)
Virtual Visit via Telephone Note  I connected with Alexis Ali, on 07/25/2019 at 10:26 AM by telephone due to the COVID-19 pandemic and verified that I am speaking with the correct person using two identifiers.   Consent: I discussed the limitations, risks, security and privacy concerns of performing an evaluation and management service by telephone and the availability of in person appointments. I also discussed with the patient that there may be a patient responsible charge related to this service. The patient expressed understanding and agreed to proceed.   Location of Patient: Home   Location of Provider: Clinic    Persons participating in Telemedicine visit: Windie P Hagarty Charolotte Eke Dr. Earlene Plater      History of Present Illness: Patient has a visit to establish care.   She requests referral to [facility]. Patient is a G3P1. She just found out she was pregnant---she took two HPTs both positive. LMP 2/21. Not yet taking PNV. She had one episode of emesis yesterday, that was the first time she had gotten sick this pregnancy. Denies problems with first pregnancy---no DM, Pre-E. However, she says she was told she was a diabetic when she had a SAB in July 2020--she had a D&E as she was about [redacted] weeks pregnant. Patient is not taking any prescribed or OTC medications. History of a vaginal delivery with her son.    Past Medical History:  Diagnosis Date  . Diabetes mellitus without complication (HCC)    No Known Allergies  No current outpatient medications on file prior to visit.   No current facility-administered medications on file prior to visit.    Observations/Objective: NAD. Speaking clearly.  Work of breathing normal.  Alert and oriented. Mood appropriate.   Assessment and Plan: 1. Encounter to establish care  2. [redacted] weeks gestation of pregnancy Patient is 5w2 with EDD of 03/24/20 based on first day of LMP. Patient has a history of DM with her recent SAB, would  likely benefit from early glucose testing and consider ASA therapy as well. Sent in Rx for PNV. Counseled on nausea/vomiting in first trimester. Discussed reasons to seek emergency care during pregnancy at [facility]. Per patient request, she would like to establish with [facility] again for prenatal care.  - Ambulatory referral to Obstetrics / Gynecology - Prenatal Vit-Fe Fumarate-FA (PRENATAL VITAMINS) 28-0.8 MG TABS; Take 1 tablet by mouth daily.  Dispense: 30 tablet; Refill: 11   Follow Up Instructions: Annual exam or as needed    I discussed the assessment and treatment plan with the patient. The patient was provided an opportunity to ask questions and all were answered. The patient agreed with the plan and demonstrated an understanding of the instructions.   The patient was advised to call back or seek an in-person evaluation if the symptoms worsen or if the condition fails to improve as anticipated.     I provided 12 minutes total of non-face-to-face time during this encounter including median intraservice time, reviewing previous notes, investigations, ordering medications, medical decision making, coordinating care and patient verbalized understanding at the end of the visit.    Marcy Siren, D.O. Primary Care at [facility]  07/25/2019, 10:26 AM

## 2019-07-25 NOTE — ED Triage Notes (Signed)
Pt presents for possible pregnancy. 

## 2019-07-26 NOTE — ED Provider Notes (Signed)
[place]    CSN: [id number] Arrival date & time: 07/25/19  1454      History   Chief Complaint Chief Complaint  Patient presents with  . Possible Pregnancy    HPI Alexis Ali is a 34 y.o. female.   Pt is a 34 year old female that presents with possible pregnancy. Had positive test at home. Here for confirmation. Otherwise she is feeling okay. Has plans to follow up with OB/GYN.     Past Medical History:  Diagnosis Date  . Diabetes mellitus without complication [facility])     Patient Active Problem List   Diagnosis Date Noted  . Intrautetine Fetal Demise at [redacted] weeks gestation 11/16/2018  . Alpha thalassemia silent carrier (aa/a-) 10/31/2018  . 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 Type 2 diabetes mellitus affecting pregnancy, antepartum 10/03/2018  . Supervision of high-risk pregnancy 09/30/2018    Past Surgical History:  Procedure Laterality Date  . DILATION AND EVACUATION N/A 11/16/2018   Procedure: DILATATION AND EVACUATION;  Surgeon: Osborne Oman, MD;  Location: [place];  Service: Gynecology;  Laterality: N/A;  . NO PAST SURGERIES    . OPERATIVE ULTRASOUND N/A 11/16/2018   Procedure: OPERATIVE ULTRASOUND;  Surgeon: Osborne Oman, MD;  Location: [place];  Service: Gynecology;  Laterality: N/A;    OB History    Gravida  3   Para  1   Term  1   Preterm      AB      Living  1     SAB      TAB      Ectopic      Multiple      Live Births  1            Home Medications    Prior to Admission medications   Medication Sig Start Date End Date Taking? Authorizing Provider  Prenatal Vit-Fe Fumarate-FA (PRENATAL VITAMINS) 28-0.8 MG TABS Take 1 tablet by mouth daily. 07/25/19   Nicolette Bang, DO    Family History History reviewed. No pertinent family history.  Social History Social History   Tobacco Use  . Smoking status: Never Smoker  . Smokeless tobacco: Never  Used  Substance Use Topics  . Alcohol use: Not Currently  . Drug use: Never     Allergies   Patient has no known allergies.   Review of Systems Review of Systems  Gastrointestinal: Negative for abdominal pain.  Genitourinary: Negative for difficulty urinating, vaginal bleeding and vaginal discharge.     Physical Exam Triage Vital Signs ED Triage Vitals  Enc Vitals Group     BP 07/25/19 1531 122/66     Pulse Rate 07/25/19 1531 82     Resp 07/25/19 1531 16     Temp 07/25/19 1531 98.7 F (37.1 C)     Temp Source 07/25/19 1531 Oral     SpO2 07/25/19 1531 97 %     Weight --      Height --      Head Circumference --      Peak Flow --      Pain Score 07/25/19 1540 0     Pain Loc --      Pain Edu? --      Excl. in [place]? --    No data found.  Updated Vital Signs BP 122/66 (BP Location: Left Arm)   Pulse 82   Temp 98.7 F (37.1 C) (Oral)  Resp 16   LMP 06/18/2019   SpO2 97%   Breastfeeding Unknown   Visual Acuity Right Eye Distance:   Left Eye Distance:   Bilateral Distance:    Right Eye Near:   Left Eye Near:    Bilateral Near:     Physical Exam Vitals and nursing note reviewed.  Constitutional:      General: She is not in acute distress.    Appearance: Normal appearance. She is not ill-appearing, toxic-appearing or diaphoretic.  HENT:     Head: Normocephalic.     Nose: Nose normal.  Eyes:     Conjunctiva/sclera: Conjunctivae normal.  Pulmonary:     Effort: Pulmonary effort is normal.  Musculoskeletal:        General: Normal range of motion.     Cervical back: Normal range of motion.  Skin:    General: Skin is warm and dry.     Findings: No rash.  Neurological:     Mental Status: She is alert.  Psychiatric:        Mood and Affect: Mood normal.      UC Treatments / Results  Labs (all labs ordered are listed, but only abnormal results are displayed) Labs Reviewed  POC URINE PREG, ED - Abnormal; Notable for the following components:       Result Value   Preg Test, Ur POSITIVE (*)    All other components within normal limits  POCT PREGNANCY, URINE - Abnormal; Notable for the following components:   Preg Test, Ur POSITIVE (*)    All other components within normal limits  POC URINE PREG, ED - Abnormal; Notable for the following components:   Preg Test, Ur POSITIVE (*)    All other components within normal limits    EKG   Radiology No results found.  Procedures Procedures (including critical care time)  Medications Ordered in UC Medications - No data to display  Initial Impression / Assessment and Plan / UC Course  I have reviewed the triage vital signs and the nursing notes.  Pertinent labs & imaging results that were available during my care of the patient were reviewed by me and considered in my medical decision making (see chart for details).  Clinical Course as of Jul 26 823  Tue Jul 25, 2019  1543 Preg Test, Ur(!): POSITIVE [TB]    Clinical Course User Index [TB] Dahlia Byes A, NP    pregnancy test positive-  follow up with OB/GYN Final Clinical Impressions(s) / UC Diagnoses   Final diagnoses:  Positive pregnancy test     Discharge Instructions     Pregnancy test is positive  Keep taking your prenatals.  Follow up with [facility] for further care.        ED Prescriptions    None     PDMP not reviewed this encounter.   Janace Aris, NP 07/26/19 [id number]

## 2019-08-28 ENCOUNTER — Ambulatory Visit (INDEPENDENT_AMBULATORY_CARE_PROVIDER_SITE_OTHER): Payer: Medicaid Other

## 2019-08-28 DIAGNOSIS — O0990 Supervision of high risk pregnancy, unspecified, unspecified trimester: Secondary | ICD-10-CM | POA: Insufficient documentation

## 2019-08-28 DIAGNOSIS — O09291 Supervision of pregnancy with other poor reproductive or obstetric history, first trimester: Secondary | ICD-10-CM

## 2019-08-28 DIAGNOSIS — Z3A10 10 weeks gestation of pregnancy: Secondary | ICD-10-CM

## 2019-08-28 MED ORDER — BLOOD PRESSURE KIT DEVI: 1.0000 | 0 refills | Status: DC

## 2019-08-28 NOTE — Progress Notes (Addendum)
I connected with  Alexis Ali on 08/28/19 by a video enabled telemedicine application and verified that I am speaking with the correct person using two identifiers.   I discussed the limitations of evaluation and management by telemedicine. The patient expressed understanding and agreed to proceed.  PRENATAL INTAKE SUMMARY  Alexis Ali presents today New OB Nurse Interview.  OB History    Gravida  3   Para  1   Term  1   Preterm      AB  1   Living  1     SAB      TAB      Ectopic      Multiple      Live Births  1          I have reviewed the patient's medical, obstetrical, social, and family histories, medications, and available lab results.  SUBJECTIVE She has no unusual complaints and NV and fatigue.  OBJECTIVE Initial Intake Interview (New OB)  GENERAL APPEARANCE: alert, well appearing, oriented to person, place and time   ASSESSMENT Normal pregnancy LMP  06/18/19 EDD 03/24/2020  [redacted]w[redacted]d PHQ-9=3   PLAN Prenatal care OB Labs will be done at NOB visit BP Cuff was ordered, patient to pick up before NOB visit. NOB Appt 09/04/2019 @ 10:15 am

## 2019-08-28 NOTE — Progress Notes (Signed)
Patient seen and assessed by nursing staff during this encounter. I have reviewed the chart and agree with the documentation and plan. I have also made any necessary editorial changes.  Cherre Robins, CNM 08/28/2019 1:11 PM

## 2019-09-04 ENCOUNTER — Ambulatory Visit (INDEPENDENT_AMBULATORY_CARE_PROVIDER_SITE_OTHER): Payer: Medicaid Other

## 2019-09-04 ENCOUNTER — Other Ambulatory Visit: Payer: Self-pay

## 2019-09-04 ENCOUNTER — Other Ambulatory Visit (HOSPITAL_COMMUNITY)
Admission: RE | Admit: 2019-09-04 | Discharge: 2019-09-04 | Disposition: A | Discharge status: Home / Self Care | Payer: Medicaid Other | Source: Ambulatory Visit

## 2019-09-04 VITALS — BP 123/78 | HR 96 | Wt 231.0 lb

## 2019-09-04 DIAGNOSIS — Z3481 Encounter for supervision of other normal pregnancy, first trimester: Secondary | ICD-10-CM | POA: Diagnosis not present

## 2019-09-04 DIAGNOSIS — Z3A11 11 weeks gestation of pregnancy: Secondary | ICD-10-CM

## 2019-09-04 DIAGNOSIS — O0991 Supervision of high risk pregnancy, unspecified, first trimester: Secondary | ICD-10-CM

## 2019-09-04 DIAGNOSIS — O99210 Obesity complicating pregnancy, unspecified trimester: Secondary | ICD-10-CM | POA: Insufficient documentation

## 2019-09-04 DIAGNOSIS — O0990 Supervision of high risk pregnancy, unspecified, unspecified trimester: Secondary | ICD-10-CM | POA: Insufficient documentation

## 2019-09-04 DIAGNOSIS — O900 Disruption of cesarean delivery wound: Secondary | ICD-10-CM | POA: Diagnosis not present

## 2019-09-04 DIAGNOSIS — B9689 Other specified bacterial agents as the cause of diseases classified elsewhere: Secondary | ICD-10-CM

## 2019-09-04 DIAGNOSIS — N760 Acute vaginitis: Secondary | ICD-10-CM

## 2019-09-04 DIAGNOSIS — Z8759 Personal history of other complications of pregnancy, childbirth and the puerperium: Secondary | ICD-10-CM | POA: Insufficient documentation

## 2019-09-04 DIAGNOSIS — O99211 Obesity complicating pregnancy, first trimester: Secondary | ICD-10-CM

## 2019-09-04 DIAGNOSIS — E669 Obesity, unspecified: Secondary | ICD-10-CM

## 2019-09-04 MED ORDER — DOXYLAMINE-PYRIDOXINE 10-10 MG PO TBEC: 2.0000 | DELAYED_RELEASE_TABLET | Freq: Every day | ORAL | 2 refills | Status: DC

## 2019-09-04 MED ORDER — ASPIRIN EC 81 MG PO TBEC: 81.0000 mg | DELAYED_RELEASE_TABLET | Freq: Every day | ORAL | 2 refills | Status: DC

## 2019-09-04 MED ORDER — METRONIDAZOLE 0.75 % VA GEL: 1.0000 | Freq: Every day | VAGINAL | 0 refills | Status: DC

## 2019-09-04 NOTE — Progress Notes (Signed)
Subjective:   Alexis Ali is a 34 y.o. G3P1011 at 50w1dby Definite LMP of Jun 18, 2019 being seen today for her first obstetrical visit.  Her obstetrical history is significant for obesity. Patient does intend to breast feed. Pregnancy history fully reviewed.  Patient reports frequent nausea with occasional vomiting. She denies constipation, diarrhea, or issues with urination.    She denies tobacco, ETOH, or Drug Use prior to the pregnancy.    Patient denies pain or discomfort with sex as well as any vaginal concerns including discharge, odor, or irritation.   Support Person: Unknown FOB: [name]-Not together Current Employer: None Patient endorses safety at home and denies DV/A.   HISTORY: OB History  Gravida Para Term Preterm AB Living  '3 1 1 ' 0 1 1  SAB TAB Ectopic Multiple Live Births  1 0 0 0 1    # Outcome Date GA Lbr Len/2nd Weight Sex Delivery Anes PTL Lv  3 Current           2 SAB 11/14/18 160w0d       1 Term 06/19/06 3819w0dM Vag-Spont   LIV    Last pap smear was done June 2020 and was normal  Past Medical History:  Diagnosis Date  . Depression   . [place] ([place] . Gestational diabetes   . Vaginal Pap smear, abnormal    Past Surgical History:  Procedure Laterality Date  . DILATION AND CURETTAGE OF UTERUS    . DILATION AND EVACUATION N/A 11/16/2018   Procedure: DILATATION AND EVACUATION;  Surgeon: AnyOsborne OmanD;  [facility];  Laterality: N/A;  . NO PAST SURGERIES    . OPERATIVE ULTRASOUND N/A 11/16/2018   Procedure: OPERATIVE ULTRASOUND;  Surgeon: AnyOsborne OmanD;  [facility];  Laterality: N/A;   Family History  Problem Relation Age of Onset  . Diabetes Mother   . Hypertension Mother   . Kidney disease Mother   . Stroke Mother   . Arthritis Maternal Grandmother    Social History   Tobacco Use  . Smoking  status: Former Smoker    Types: Cigars    Quit date: 05/29/2019    Years since quitting: 0.2  . Smokeless tobacco: Never Used  Substance Use Topics  . Alcohol use: Not Currently  . Drug use: Never   No Known Allergies Current Outpatient Medications on File Prior to Visit  Medication Sig Dispense Refill  . Blood Pressure Monitoring (BLOOD PRESSURE KIT) DEVI 1 kit by Does not apply route once a week. Check Blood Pressure regularly and record readings into the Babyscripts App.  Large Cuff.  DX O90.0 1 each 0  . Prenatal Vit-Fe Fumarate-FA (PRENATAL VITAMINS) 28-0.8 MG TABS Take 1 tablet by mouth daily. 30 tablet 11   No current facility-administered medications on file prior to visit.    Review of Systems Pertinent items noted in HPI and remainder of comprehensive ROS otherwise negative.  Exam   Vitals:   09/04/19 0957  BP: 123/78  Pulse: 96  Weight: 231 lb (104.8 kg)   Fetal Heart Rate (bpm): 168  Physical Exam Constitutional:      Appearance: Normal appearance. She is obese.  Genitourinary:     Urethra normal.     No vulval lesion or tenderness noted.     No signs of labial injury.  No lesions in the vagina.     Vaginal discharge (Moderate amt thin white discharge with malodor.) present.     No vaginal erythema, bleeding or ulceration.     No cervical discharge, friability, polyp or nabothian cyst.     Genitourinary Comments: Uterine size difficult to assess d/t position and body habitus. CV collected   HENT:     Head: Normocephalic and atraumatic.  Eyes:     Conjunctiva/sclera: Conjunctivae normal.  Neck:     Thyroid: No thyroid mass, thyromegaly or thyroid tenderness.     Trachea: Trachea normal.  Cardiovascular:     Rate and Rhythm: Normal rate and regular rhythm.     Heart sounds: Normal heart sounds.  Pulmonary:     Effort: Pulmonary effort is normal. No respiratory distress.     Breath sounds: Normal breath sounds.  Abdominal:     General: Bowel sounds  are normal.     Palpations: Abdomen is soft.  Musculoskeletal:        General: No tenderness. Normal range of motion.     Cervical back: Normal range of motion. No rigidity.     Right lower leg: No edema.     Left lower leg: No edema.  Neurological:     Mental Status: She is alert and oriented to person, place, and time.  Skin:    General: Skin is warm and dry.  Psychiatric:        Mood and Affect: Mood normal.        Behavior: Behavior normal.        Thought Content: Thought content normal.     Assessment:   34 y.o. year old G3P1011 Patient Active Problem List   Diagnosis Date Noted  . Supervision of high risk pregnancy, antepartum 08/28/2019  . Intrautetine Fetal Demise at [redacted] weeks gestation 11/16/2018  . Alpha thalassemia silent carrier (aa/a-) 10/31/2018  . 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 Type 2 diabetes mellitus affecting pregnancy, antepartum 10/03/2018     Plan:  1. Supervision of high risk pregnancy, antepartum -Congratulations given and patient welcomed to practice. -Discussed usage of Babyscripts and virtual visits as additional source of managing and completing PN visits in midst of coronavirus.   -Anticipatory guidance for prenatal visits including labs, ultrasounds, and testing; Initial labs drawn. -Genetic Screening discussed, First trimester screen, Quad screen and NIPS: requested. -Encouraged to complete MyChart Registration for her ability to review results, send requests, and have questions addressed.  -Discussed estimated due date of March 24, 2020. -Ultrasound discussed; fetal anatomic survey: discussed, will plan to schedule after next visit.. -Initiate prenatal vitamins; Rx sent to pharmacy on file.  -Encouraged to seek out care at office or emergency room for urgent and/or emergent concerns. -Educated on the nature of [facility] with multiple MDs and other Advanced Practice Providers was explained  to patient; also emphasized that residents, students are part of our team. Informed of her right to refuse care as she deems appropriate.  -No questions or concerns.    2. Obesity in pregnancy, antepartum -Labs per protocol -Will start on baby aspirin regime next week. -Prescription sent to pharmacy on file. -Will repeat HgB A1C; discussed possibility of repeat 2hr if abnormal.   3. History of IUFD -Patient desires genetic screening -Will wait 2-3 weeks d/t obesity. -Scheduled for lab only visit  4. Bacterial vaginosis -By examination. -Rx for metrogel sent to pharmacy on file.    5. Nausea  in pregnancy -Rx for Diclegis sent to pharmacy on file.  -Instructions for usage given.    Problem list reviewed and updated. Routine obstetric precautions reviewed.  No orders of the defined types were placed in this encounter.   No follow-ups on file.     Maryann Conners, CNM 09/04/2019 10:31 AM

## 2019-09-04 NOTE — Patient Instructions (Signed)
Pregnancy After Age 35 Women who become pregnant after the age of 35 have a higher risk for certain problems during pregnancy. This is because older women may already have health problems before becoming pregnant. Older women who are healthy before pregnancy may still develop problems during pregnancy. These problems may affect the mother, the unborn baby (fetus), or both. What are the risks for me? If you are over age 35 and you want to become pregnant or are pregnant, you may have a higher risk of:  Not being able to get pregnant (infertility).  Going into labor early (preterm labor).  Needing surgical delivery of your baby (cesarean delivery, or C-section).  Having high blood pressure (hypertension).  Having complications during pregnancy, such as high blood pressure and other symptoms (preeclampsia).  Having diabetes during pregnancy (gestational diabetes).  Being pregnant with more than one baby.  Loss of the unborn baby before 20 weeks (miscarriage) or after 20 weeks of pregnancy (stillbirth). What are the risks for my baby? Babies born to women over the age of 35 have a higher risk for:  Being born early (prematurity).  Low birth weight, which is less than 5 lb, 8 oz (2.5 kg).  Birth defects, such as Down syndrome and cleft palate.  Health complications, including problems with growth and development. How is prenatal care different for women over age 35? All women should see their health care provider before they try to become pregnant. This is especially important for women over the age of 35. Tell your health care provider about:  Any health problems you have.  Any medicines you take.  Any family history of health problems or chromosome-related defects.  Any problems you have had with past pregnancies or deliveries. If you are over age 35 and you plan to become pregnant:  Start taking a daily multivitamin a month or more before you try to get pregnant. Your  multivitamin should contain 400 mcg (micrograms) of folic acid. If you are over age 35 and pregnant, make sure you:  Keep taking your multivitamin unless your health care provider tells you not to take it.  Keep all prenatal visits as told by your health care provider. This is important.  Have ultrasounds regularly throughout your pregnancy to check for problems.  Talk with your health care provider about other prenatal screening tests that you may need. What additional prenatal tests are needed? Screening tests show whether your baby has a higher risk for birth defects than other babies. Screening tests include:  Ultrasound tests to look for markers that indicate a risk for birth defects.  Maternal blood screening. These are blood tests that measure certain substances in your blood to determine your baby's risk for defects. Screening tests do not show whether your baby has or does not have defects. They only show your baby's risk for certain defects. If your screening tests show that risk factors are present, you may need tests to confirm the defect (diagnostic testing). These tests may include:  Chorionic villus sampling. For this procedure, a tissue sample is taken from the organ that forms in your uterus to nourish your baby (placenta). The sample is removed through your cervix or abdomen and tested.  Amniocentesis. For this procedure, a small amount of the fluid that surrounds the baby in the uterus (amniotic fluid) is removed and tested. What can I do to stay healthy during my pregnancy? Staying healthy during pregnancy can help you and your baby to have a lower risk for   problems during pregnancy, during delivery, or both. Talk with your health care provider for specific instructions about staying healthy during your pregnancy. Nutrition   At each meal, eat a variety of foods from each of the five food groups. These groups include: ? Proteins such as lean meats, poultry, fish that is  low in fat, beans, eggs, and nuts. ? Vegetables such as leafy greens, raw and cooked vegetables, and vegetable juice. ? Fruits that are fresh, frozen, or canned, or 100% fruit juice. ? Dairy products such as low-fat yogurt, cheese, and milk. ? Whole grains including rice, cereal, pasta, and bread.  Talk with your health care provider about how much food in each group is right for you.  Follow instructions from your health care provider about eating and drinking restrictions during pregnancy. ? Do not eat raw eggs, raw meat, or raw fish or seafood. ? Do not eat any fish that contains high amounts of mercury, such as swordfish or mackerel.  Drink 6-8 or more glasses of water a day. You should drink enough fluid to keep your urine pale yellow. Managing weight gain  Ask your health care provider how much weight gain is healthy during pregnancy.  Stay at a healthy weight. If needed, work with your health care provider to lose weight safely. Activity  Exercise regularly, as directed by your health care provider. Ask your health care provider what forms of exercise are safe for you. General instructions  Do not use any products that contain nicotine or tobacco, such as cigarettes and e-cigarettes. If you need help quitting, ask your health care provider.  Do not drink alcohol, use drugs, or abuse prescription medicine.  Take over-the-counter and prescription medicines only as told by your health care provider.  Do not use hot tubs, steam rooms, or saunas.  Talk with your health care provider about your risk of exposure to harmful environmental conditions. This includes exposure to chemicals, radiation, cleaning products, and cat feces. Follow advice from your health care provider about how to limit your exposure. Summary  Women who become pregnant after the age of 35 have a higher risk for complications during pregnancy.  Problems may affect the mother, the unborn baby (fetus), or  both.  All women should see their health care provider before they try to become pregnant. This is especially important for women over the age of 35.  Staying healthy during pregnancy can help both you and your baby to have a lower risk for some of the problems that can happen during pregnancy, during delivery, or both. This information is not intended to replace advice given to you by your health care provider. Make sure you discuss any questions you have with your health care provider. Document Revised: 08/05/2018 Document Reviewed: 08/03/2016 Elsevier Patient Education  2020 [employer] Inc.  

## 2019-09-04 NOTE — Progress Notes (Signed)
NOB in office reports nausea and vomiting and would like rx to be sent. Pt denies pain, states that she has BP cuff. NOB intake completed on 08-28-19.

## 2019-09-05 LAB — COMPREHENSIVE METABOLIC PANEL
ALT: 5 IU/L (ref 0–32)
AST: 11 IU/L (ref 0–40)
Albumin/Globulin Ratio: 1.7 (ref 1.2–2.2)
Albumin: 3.9 g/dL (ref 3.8–4.8)
Alkaline Phosphatase: 51 IU/L (ref 39–117)
BUN/Creatinine Ratio: 16 (ref 9–23)
BUN: 11 mg/dL (ref 6–20)
Bilirubin Total: <0.2 mg/dL (ref 0.0–1.2)
CO2: 19 mmol/L — ABNORMAL LOW (ref 20–29)
Calcium: 9.6 mg/dL (ref 8.7–10.2)
Chloride: 105 mmol/L (ref 96–106)
Creatinine, Ser: 0.69 mg/dL (ref 0.57–1.00)
GFR calc Af Amer: 131 mL/min/{1.73_m2} (ref >59)
GFR calc non Af Amer: 114 mL/min/{1.73_m2} (ref >59)
Globulin, Total: 2.3 g/dL (ref 1.5–4.5)
Glucose: 113 mg/dL — ABNORMAL HIGH (ref 65–99)
Potassium: 3.8 mmol/L (ref 3.5–5.2)
Sodium: 138 mmol/L (ref 134–144)
Total Protein: 6.2 g/dL (ref 6.0–8.5)

## 2019-09-05 LAB — OBSTETRIC PANEL, INCLUDING HIV
Antibody Screen: NEGATIVE
Basophils Absolute: 0 10*3/uL (ref 0.0–0.2)
Basos: 0 %
EOS (ABSOLUTE): 0.1 10*3/uL (ref 0.0–0.4)
Eos: 1 %
HIV Screen 4th Generation wRfx: NONREACTIVE
Hematocrit: 35.5 % (ref 34.0–46.6)
Hemoglobin: 11.3 g/dL (ref 11.1–15.9)
Hepatitis B Surface Ag: NEGATIVE
Immature Grans (Abs): 0 10*3/uL (ref 0.0–0.1)
Immature Granulocytes: 1 %
Lymphocytes Absolute: 2 10*3/uL (ref 0.7–3.1)
Lymphs: 28 %
MCH: 25.8 pg — ABNORMAL LOW (ref 26.6–33.0)
MCHC: 31.8 g/dL (ref 31.5–35.7)
MCV: 81 fL (ref 79–97)
Monocytes Absolute: 0.4 10*3/uL (ref 0.1–0.9)
Monocytes: 6 %
Neutrophils Absolute: 4.6 10*3/uL (ref 1.4–7.0)
Neutrophils: 64 %
Platelets: 244 10*3/uL (ref 150–450)
RBC: 4.38 x10E6/uL (ref 3.77–5.28)
RDW: 13.7 % (ref 11.7–15.4)
RPR Ser Ql: NONREACTIVE
Rh Factor: POSITIVE
Rubella Antibodies, IGG: 3.67 index (ref >0.99)
WBC: 7.2 10*3/uL (ref 3.4–10.8)

## 2019-09-05 LAB — CERVICOVAGINAL ANCILLARY ONLY
Bacterial Vaginitis (gardnerella): POSITIVE — AB
Candida Glabrata: NEGATIVE
Candida Vaginitis: NEGATIVE
Chlamydia: NEGATIVE
Comment: NEGATIVE
Comment: NEGATIVE
Comment: NEGATIVE
Comment: NEGATIVE
Comment: NEGATIVE
Comment: NORMAL
Neisseria Gonorrhea: NEGATIVE
Trichomonas: NEGATIVE

## 2019-09-05 LAB — HEMOGLOBIN A1C
Est. average glucose Bld gHb Est-mCnc: 114 mg/dL
Hgb A1c MFr Bld: 5.6 % (ref 4.8–5.6)

## 2019-09-05 LAB — HEPATITIS C ANTIBODY: Hep C Virus Ab: <0.1 s/co ratio (ref 0.0–0.9)

## 2019-09-06 LAB — URINE CULTURE, OB REFLEX

## 2019-09-06 LAB — CULTURE, OB URINE

## 2019-09-26 ENCOUNTER — Other Ambulatory Visit: Payer: Medicaid Other

## 2019-09-26 ENCOUNTER — Other Ambulatory Visit: Payer: Self-pay

## 2019-09-26 DIAGNOSIS — Z315 Encounter for genetic counseling: Secondary | ICD-10-CM | POA: Diagnosis not present

## 2019-09-26 DIAGNOSIS — Z3482 Encounter for supervision of other normal pregnancy, second trimester: Secondary | ICD-10-CM | POA: Diagnosis not present

## 2019-10-02 ENCOUNTER — Telehealth: Payer: Self-pay

## 2019-10-02 ENCOUNTER — Other Ambulatory Visit: Payer: Self-pay | Admitting: Obstetrics and Gynecology

## 2019-10-02 NOTE — Telephone Encounter (Signed)
[facility] called and reports an extra line of DNA on this patients genetic screening and state that this could mean twins, vanishing twin, or triploid. Please advise.

## 2019-10-09 ENCOUNTER — Encounter: Payer: Self-pay | Admitting: Obstetrics & Gynecology

## 2019-10-09 ENCOUNTER — Ambulatory Visit (INDEPENDENT_AMBULATORY_CARE_PROVIDER_SITE_OTHER): Payer: Medicaid Other | Admitting: Obstetrics & Gynecology

## 2019-10-09 ENCOUNTER — Other Ambulatory Visit: Payer: Self-pay

## 2019-10-09 VITALS — BP 122/84 | HR 97 | Wt 244.0 lb

## 2019-10-09 DIAGNOSIS — Z3A16 16 weeks gestation of pregnancy: Secondary | ICD-10-CM

## 2019-10-09 DIAGNOSIS — O30049 Twin pregnancy, dichorionic/diamniotic, unspecified trimester: Secondary | ICD-10-CM

## 2019-10-09 DIAGNOSIS — O30042 Twin pregnancy, dichorionic/diamniotic, second trimester: Secondary | ICD-10-CM

## 2019-10-09 DIAGNOSIS — O24119 Pre-existing diabetes mellitus, type 2, in pregnancy, unspecified trimester: Secondary | ICD-10-CM

## 2019-10-09 DIAGNOSIS — O0990 Supervision of high risk pregnancy, unspecified, unspecified trimester: Secondary | ICD-10-CM

## 2019-10-09 DIAGNOSIS — O0992 Supervision of high risk pregnancy, unspecified, second trimester: Secondary | ICD-10-CM

## 2019-10-09 DIAGNOSIS — O30009 Twin pregnancy, unspecified number of placenta and unspecified number of amniotic sacs, unspecified trimester: Secondary | ICD-10-CM | POA: Insufficient documentation

## 2019-10-09 NOTE — Progress Notes (Signed)
Patient reports feeling some fetal movement, denies pain.

## 2019-10-09 NOTE — Progress Notes (Signed)
   PRENATAL VISIT NOTE  Subjective:  Alexis Ali is a 34 y.o. G3P1011 at [redacted]w[redacted]d being seen today for ongoing prenatal care.  She is currently monitored for the following issues for this high-risk pregnancy and has Type 2 diabetes mellitus affecting pregnancy, antepartum; Alpha thalassemia silent carrier (aa/a-); Monoallelic mutation of SMN1 gene; Supervision of high risk pregnancy, antepartum; History of IUFD; Obesity in pregnancy, antepartum; and Pregnancy, twins, antepartum on their problem list.  Patient reports no complaints.  Contractions: Not present. Vag. Bleeding: None.  Movement: Present. Denies leaking of fluid.   The following portions of the patient's history were reviewed and updated as appropriate: allergies, current medications, past family history, past medical history, past social history, past surgical history and problem list.   Objective:   Vitals:   10/09/19 1030  BP: 122/84  Pulse: 97  Weight: 244 lb (110.7 kg)    Fetal Status: Fetal Heart Rate (bpm): 156 and 160   Movement: Present     Bedside scan: Twins noted, both viable, both ~16 weeks.  Dividing membrane noted.    General:  Alert, oriented and cooperative. Patient is in no acute distress.  Skin: Skin is warm and dry. No rash noted.   Cardiovascular: Normal heart rate noted  Respiratory: Normal respiratory effort, no problems with respiration noted  Abdomen: Soft, gravid, appropriate for gestational age.  Pain/Pressure: Absent     Pelvic: Cervical exam deferred        Extremities: Normal range of motion.  Edema: None  Mental Status: Normal mood and affect. Normal behavior. Normal judgment and thought content.   Assessment and Plan:  Pregnancy: G3P1011 at [redacted]w[redacted]d 1. Type 2 diabetes mellitus affecting pregnancy, antepartum 2. Dichorionic diamniotic twin pregnancy, antepartum 3. Supervision of high risk pregnancy, antepartum Panorama showed possible twin gestation; this was verified on clinic scan today.  Patient informed.  Formal anatomy scan ordered at MFM, also to confirm chorionicity. Redraw of NIPS done, AFP also ordered.  Did not bring blood sugar log, reports sugars within range, was advised to bring at every appointment as instructed.  A1C to be checked today.  Continue ASA. Fetal ECHO to be scheduled. - Genetic Screening - AFP, Serum, Open Spina Bifida - Hemoglobin A1c - [place] MFM OB DETAIL +14 WK; Future - [place] MFM OB DETAIL ADDL GEST +14 WK; Future No other complaints or concerns.  Routine obstetric precautions reviewed. Please refer to After Visit Summary for other counseling recommendations.   No follow-ups on file.  Future Appointments  Date Time Provider Department Center  11/01/2019  8:30 AM [facility] NURSE [facility]  11/01/2019  8:30 AM [facility] US[facility]  11/06/2019 10:15 AM Johnny Bridge, MD [facility] None    [name], MD

## 2019-10-09 NOTE — Patient Instructions (Signed)
Return to office for any scheduled appointments. Call the office or go to the [facility] at [facility] [place] if:  You begin to have strong, frequent contractions  Your water breaks.  Sometimes it is a big gush of fluid, sometimes it is just a trickle that keeps getting your panties wet or running down your legs  You have vaginal bleeding.  It is normal to have a small amount of spotting if your cervix was checked.   You do not feel your baby moving like normal.  If you do not, get something to eat and drink and lay down and focus on feeling your baby move.   If your baby is still not moving like normal, you should call the office or go to MAU.  Any other obstetric concerns.   

## 2019-10-11 LAB — AFP, SERUM, OPEN SPINA BIFIDA
AFP MoM: 4.39
AFP Value: 125.9 ng/mL
Gest. Age on Collection Date: 16.1 weeks
Maternal Age At EDD: 35.4 yr
OSBR Risk 1 IN: 214
Test Results:: NEGATIVE
Weight: 244 [lb_av]

## 2019-10-11 LAB — HEMOGLOBIN A1C
Est. average glucose Bld gHb Est-mCnc: 117 mg/dL
Hgb A1c MFr Bld: 5.7 % — ABNORMAL HIGH (ref 4.8–5.6)

## 2019-10-18 ENCOUNTER — Encounter: Payer: Self-pay | Admitting: Obstetrics & Gynecology

## 2019-10-19 ENCOUNTER — Encounter: Payer: Self-pay | Admitting: Obstetrics and Gynecology

## 2019-11-01 ENCOUNTER — Other Ambulatory Visit: Payer: Self-pay

## 2019-11-01 ENCOUNTER — Encounter: Payer: Self-pay | Admitting: Obstetrics & Gynecology

## 2019-11-01 ENCOUNTER — Other Ambulatory Visit: Payer: Self-pay | Admitting: *Deleted

## 2019-11-01 ENCOUNTER — Ambulatory Visit: Payer: Medicaid Other | Attending: Obstetrics and Gynecology

## 2019-11-01 ENCOUNTER — Ambulatory Visit: Payer: Medicaid Other | Attending: Obstetrics

## 2019-11-01 ENCOUNTER — Ambulatory Visit: Payer: Medicaid Other | Admitting: *Deleted

## 2019-11-01 DIAGNOSIS — O30049 Twin pregnancy, dichorionic/diamniotic, unspecified trimester: Secondary | ICD-10-CM | POA: Insufficient documentation

## 2019-11-01 DIAGNOSIS — O24419 Gestational diabetes mellitus in pregnancy, unspecified control: Secondary | ICD-10-CM | POA: Insufficient documentation

## 2019-11-01 DIAGNOSIS — O4442 Low lying placenta NOS or without hemorrhage, second trimester: Secondary | ICD-10-CM | POA: Insufficient documentation

## 2019-11-01 DIAGNOSIS — O99212 Obesity complicating pregnancy, second trimester: Secondary | ICD-10-CM | POA: Insufficient documentation

## 2019-11-01 DIAGNOSIS — O0990 Supervision of high risk pregnancy, unspecified, unspecified trimester: Secondary | ICD-10-CM | POA: Diagnosis present

## 2019-11-01 DIAGNOSIS — E669 Obesity, unspecified: Secondary | ICD-10-CM

## 2019-11-01 DIAGNOSIS — O24119 Pre-existing diabetes mellitus, type 2, in pregnancy, unspecified trimester: Secondary | ICD-10-CM | POA: Diagnosis present

## 2019-11-01 DIAGNOSIS — O30042 Twin pregnancy, dichorionic/diamniotic, second trimester: Secondary | ICD-10-CM | POA: Diagnosis present

## 2019-11-01 DIAGNOSIS — Z3A19 19 weeks gestation of pregnancy: Secondary | ICD-10-CM | POA: Insufficient documentation

## 2019-11-01 NOTE — Progress Notes (Unsigned)
MFM Note  This patient was seen due to a spontaneously conceived twin pregnancy.  She reports that she was diagnosed with diabetes during her last pregnancy.  It is uncertain if diabetes persisted after her delivery as she has had limited medical care after her last delivery.    The patient had a cell free DNA test earlier in her pregnancy which indicated a low risk for trisomy 46, 18, and 13.  These are predicted to be dizygotic/fraternal twins.  A female and female fetus is predicted.     A thick dividing membrane was noted separating the two fetuses, indicating that these are dichorionic, diamniotic twins.  The fetal growth and amniotic fluid level appeared appropriate for both twin A and twin B.    There were no obvious anomalies noted in either twin A or twin B today.  However, the views of the fetal anatomy were limited today due to the fetal positions.  The limitations of ultrasound in the detection of all anomalies was discussed today.  The management of dichorionic twins was discussed.  She was advised that management of twin pregnancies will involve frequent ultrasound exams to assess the fetal growth and amniotic fluid level. We will continue to follow her with serial growth ultrasounds.  Weekly fetal testing for dichorionic twins should start at around 36 weeks.  Delivery for uncomplicated dichorionic twins should occur at around 38 weeks.  The increased risk of preeclampsia, gestational diabetes, and preterm birth/labor associated with twin pregnancies was discussed.  She was advised that she will continue to be followed closely to assess for these conditions. As pregnancies with multiple gestations are at increased risk for developing preeclampsia, she was advised to continue taking a daily baby aspirin (81 mg per day) to decrease her risk of developing preeclampsia.  A low-lying placenta was noted today.  We will continue to assess this finding during her future ultrasound  exams.  The increased risk of fetal aneuploidy due to advanced maternal age was discussed. Due to advanced maternal age, the patient was offered and declined an amniocentesis today for definitive diagnosis of fetal aneuploidy.  A follow-up exam was scheduled in 4 weeks to assess the fetal growth and to complete the views of the fetal anatomy.   A total of 30 minutes was spent counseling and coordinating the care for this patient.  Greater than 50% of the time was spent in direct face-to-face contact.

## 2019-11-06 ENCOUNTER — Other Ambulatory Visit: Payer: Self-pay

## 2019-11-06 ENCOUNTER — Telehealth (INDEPENDENT_AMBULATORY_CARE_PROVIDER_SITE_OTHER): Payer: Medicaid Other | Admitting: Obstetrics and Gynecology

## 2019-11-06 ENCOUNTER — Encounter: Payer: Self-pay | Admitting: Obstetrics and Gynecology

## 2019-11-06 DIAGNOSIS — Z3A20 20 weeks gestation of pregnancy: Secondary | ICD-10-CM

## 2019-11-06 DIAGNOSIS — E669 Obesity, unspecified: Secondary | ICD-10-CM

## 2019-11-06 DIAGNOSIS — E119 Type 2 diabetes mellitus without complications: Secondary | ICD-10-CM

## 2019-11-06 DIAGNOSIS — O30042 Twin pregnancy, dichorionic/diamniotic, second trimester: Secondary | ICD-10-CM

## 2019-11-06 DIAGNOSIS — O4442 Low lying placenta NOS or without hemorrhage, second trimester: Secondary | ICD-10-CM

## 2019-11-06 DIAGNOSIS — O99212 Obesity complicating pregnancy, second trimester: Secondary | ICD-10-CM

## 2019-11-06 DIAGNOSIS — Z8759 Personal history of other complications of pregnancy, childbirth and the puerperium: Secondary | ICD-10-CM

## 2019-11-06 DIAGNOSIS — O0990 Supervision of high risk pregnancy, unspecified, unspecified trimester: Secondary | ICD-10-CM

## 2019-11-06 DIAGNOSIS — O30049 Twin pregnancy, dichorionic/diamniotic, unspecified trimester: Secondary | ICD-10-CM

## 2019-11-06 DIAGNOSIS — O0992 Supervision of high risk pregnancy, unspecified, second trimester: Secondary | ICD-10-CM

## 2019-11-06 DIAGNOSIS — O24112 Pre-existing diabetes mellitus, type 2, in pregnancy, second trimester: Secondary | ICD-10-CM

## 2019-11-06 NOTE — Progress Notes (Signed)
   PRENATAL VISIT NOTE  Subjective:  Alexis Ali is a 34 y.o. G3P1011 at [redacted]w[redacted]d being seen today for ongoing prenatal care.  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  Patient reports no complaints.  Contractions: Not present. Vag. Bleeding: None.  Movement: Present. Denies leaking of fluid.   The following portions of the patient's history were reviewed and updated as appropriate: allergies, current medications, past family history, past medical history, past social history, past surgical history and problem list.   Objective:  There were no vitals filed for this visit.  Fetal Status:     Movement: Present     General:  Alert, oriented and cooperative. Patient is in no acute distress.  Skin: Skin is warm and dry. No rash noted.   Cardiovascular: Normal heart rate noted  Respiratory: Normal respiratory effort, no problems with respiration noted  Abdomen: Soft, gravid, appropriate for gestational age.  Pain/Pressure: Absent     Pelvic: Cervical exam deferred        Extremities: Normal range of motion.     Mental Status: Normal mood and affect. Normal behavior. Normal judgment and thought content.   Assessment and Plan:  Pregnancy: G3P1011 at [redacted]w[redacted]d 1. Supervision of high risk pregnancy, antepartum   2. Dichorionic diamniotic twin pregnancy, antepartum Recent ultrasound within normal limits.  Patient for repeat ultrasound in August.    Preterm labor symptoms and general obstetric precautions including but not limited to vaginal bleeding, contractions, leaking of fluid and fetal movement were reviewed in detail with  the patient. Please refer to After Visit Summary for other counseling recommendations.   Return in about 4 weeks (around 12/04/2019) for [facility].  Future Appointments  Date Time Provider Department Center  11/16/2019 10:15 AM [facility] HEALTH CLINICIAN [facility]  11/29/2019 11:00 AM [facility] NURSE [facility]  11/29/2019 11:15 AM [facility] US2 [facility]    Johnny Bridge, MD

## 2019-11-06 NOTE — Progress Notes (Signed)
Pt is not able to check BP at time of intake.

## 2019-11-07 NOTE — BH Specialist Note (Signed)
Integrated Behavioral Health via Telemedicine Video (Caregility) Visit  11/07/2019 Alexis Ali [id number]  Number of Integrated Behavioral Health visits: 1 (2 total) Session Start time: 10:15  Session End time: 10:41 Total time: 26  Referring Provider: Kelby Aline, MD Type of Visit: Video Patient/Family location: Home [facility] Provider location: [facility]  All persons participating in visit: Patient Alexis Ali and [facility] Alexis Ali    Confirmed patient's address: Yes  Confirmed patient's phone number: Yes  Any changes to demographics: No   Confirmed patient's insurance: Yes  Any changes to patient's insurance: No   Discussed confidentiality: Yes   I connected with Avaline P Bouley  by a video enabled telemedicine application (Caregility) and verified that I am speaking with the correct person using two identifiers.     I discussed the limitations of evaluation and management by telemedicine and the availability of in person appointments.  I discussed that the purpose of this visit is to provide behavioral health care while limiting exposure to the novel coronavirus.   Discussed there is a possibility of technology failure and discussed alternative modes of communication if that failure occurs.  I discussed that engaging in this virtual visit, they consent to the provision of behavioral healthcare and the services will be billed under their insurance.  Patient and/or legal guardian expressed understanding and consented to virtual visit: Yes   PRESENTING CONCERNS: Patient and/or family reports the following symptoms/concerns: Pt states her primary concern is feeling nervous about having twins via cesarean after so long after last birth; requests prescription for prenatal vitamins, as OTC cost is prohibitive; no other questions or concerns at this time. Duration of problem: Current pregnancy; Severity of problem: mild  STRENGTHS  (Protective Factors/Coping Skills): Good social support  GOALS ADDRESSED: Patient will: 1.  Maintain reduction of symptoms of: anxiety and depression  2.  Increase knowledge and/or ability of: healthy habits  3.  Demonstrate ability to: Increase healthy adjustment to current life circumstances  INTERVENTIONS: Interventions utilized:  Solution-Focused Strategies and Psychoeducation and/or Health Education Standardized Assessments completed: GAD-7 and PHQ 9  ASSESSMENT: Patient currently experiencing Psychosocial stress .   Patient may benefit from psychoeducation and brief therapeutic interventions regarding maintaining reduction of symptoms of depression and anxiety .  PLAN: 1. Follow up with behavioral health clinician on : As needed 2. Behavioral recommendations:  -Begin taking prenatal vitamins as prescribed -Continue prioritizing healthy habits in pregnancy (healthy meals, plenty of water, healthy sleep)  3. Referral(s): Integrated [employer] (In Clinic)  I discussed the assessment and treatment plan with the patient and/or parent/guardian. They were provided an opportunity to ask questions and all were answered. They agreed with the plan and demonstrated an understanding of the instructions.   They were advised to call back or seek an in-person evaluation if the symptoms worsen or if the condition fails to improve as anticipated.  [name] Gurnie Duris   Depression screen [facility] 2/9 11/16/2019 08/28/2019 07/25/2019 12/27/2018  Decreased Interest 0 1 0 1  Down, Depressed, Hopeless 1 0 0 2  PHQ - 2 Score 1 1 0 3  Altered sleeping 0 0 1 1  Tired, decreased energy 3 2 2 1   Change in appetite 0 0 0 1  Feeling bad or failure about yourself  0 0 0 1  Trouble concentrating 0 0 0 1  Moving slowly or fidgety/restless 0 0 0 1  Suicidal thoughts 0 0 0 0  PHQ-9 Score 4 3 3 9   Difficult doing work/chores - Not difficult at all - -   GAD 7 : Generalized Anxiety Score 11/16/2019  12/27/2018  Nervous, Anxious, on Edge 1 1  Control/stop worrying 1 3  Worry too much - different things 0 1  Trouble relaxing 0 1  Restless 0 0  Easily annoyed or irritable 1 1  Afraid - awful might happen 1 2  Total GAD 7 Score 4 9

## 2019-11-13 DIAGNOSIS — Z5181 Encounter for therapeutic drug level monitoring: Secondary | ICD-10-CM | POA: Diagnosis not present

## 2019-11-16 ENCOUNTER — Ambulatory Visit (INDEPENDENT_AMBULATORY_CARE_PROVIDER_SITE_OTHER): Payer: Medicaid Other | Admitting: Clinical

## 2019-11-16 DIAGNOSIS — O30049 Twin pregnancy, dichorionic/diamniotic, unspecified trimester: Secondary | ICD-10-CM | POA: Diagnosis not present

## 2019-11-16 DIAGNOSIS — O99340 Other mental disorders complicating pregnancy, unspecified trimester: Secondary | ICD-10-CM

## 2019-11-16 DIAGNOSIS — Z658 Other specified problems related to psychosocial circumstances: Secondary | ICD-10-CM

## 2019-11-16 MED ORDER — PREPLUS 27-1 MG PO TABS: 1.0000 | ORAL_TABLET | Freq: Every day | ORAL | 13 refills | Status: DC

## 2019-11-16 NOTE — Patient Instructions (Signed)
Center for [facility] [address] [place], [place] [address] [phone] (main office) [phone] (Roxanne Panek's office)  Virtual Tour of [facility]  [email or web address]     /Emotional Wellbeing Apps and Websites Here are a few free apps meant to help you to help yourself.  To find, try searching on the internet to see if the app is offered on Apple/Android devices. If your first choice doesn't come up on your device, the good news is that there are many choices! Play around with different apps to see which ones are helpful to you.    Calm This is an app meant to help increase calm feelings. Includes info, strategies, and tools for tracking your feelings.      Calm Harm  This app is meant to help with self-harm. Provides many 5-minute or 15-min coping strategies for doing instead of hurting yourself.       Healthy Minds Health Minds is a problem-solving tool to help deal with emotions and cope with stress you encounter wherever you are.      MindShift This app can help people cope with anxiety. Rather than trying to avoid anxiety, you can make an important shift and face it.      MY3  MY3 features a support system, safety plan and resources with the goal of offering a tool to use in a time of need.       My Life My Voice  This mood journal offers a simple solution for tracking your thoughts, feelings and moods. Animated emoticons can help identify your mood.       Relax Melodies Designed to help with sleep, on this app you can mix sounds and meditations for relaxation.      Smiling Mind Smiling Mind is meditation made easy: it's a simple tool that helps put a smile on your mind.        Stop, Breathe & Think  A friendly, simple guide for people through meditations for mindfulness and compassion.  Stop, Breathe and Think Kids Enter your current feelings and choose a "mission" to help you cope. Offers videos  for certain moods instead of just sound recordings.       Team Orange The goal of this tool is to help teens change how they think, act, and react. This app helps you focus on your own good feelings and experiences.      The [employer] Box The [employer] Box (VHB) contains simple tools to help patients with coping, relaxation, distraction, and positive thinking.

## 2019-11-16 NOTE — Addendum Note (Signed)
Addended by: [name] A on: 11/16/2019 12:20 PM   Modules accepted: Orders

## 2019-11-29 ENCOUNTER — Other Ambulatory Visit: Payer: Self-pay | Admitting: *Deleted

## 2019-11-29 ENCOUNTER — Encounter: Payer: Self-pay | Admitting: *Deleted

## 2019-11-29 ENCOUNTER — Ambulatory Visit: Payer: Medicaid Other | Attending: Obstetrics and Gynecology

## 2019-11-29 ENCOUNTER — Ambulatory Visit: Payer: Medicaid Other | Admitting: *Deleted

## 2019-11-29 ENCOUNTER — Other Ambulatory Visit: Payer: Self-pay

## 2019-11-29 DIAGNOSIS — Z363 Encounter for antenatal screening for malformations: Secondary | ICD-10-CM | POA: Diagnosis not present

## 2019-11-29 DIAGNOSIS — E119 Type 2 diabetes mellitus without complications: Secondary | ICD-10-CM

## 2019-11-29 DIAGNOSIS — O09292 Supervision of pregnancy with other poor reproductive or obstetric history, second trimester: Secondary | ICD-10-CM

## 2019-11-29 DIAGNOSIS — O322XX2 Maternal care for transverse and oblique lie, fetus 2: Secondary | ICD-10-CM | POA: Diagnosis not present

## 2019-11-29 DIAGNOSIS — O321XX1 Maternal care for breech presentation, fetus 1: Secondary | ICD-10-CM | POA: Diagnosis not present

## 2019-11-29 DIAGNOSIS — Z148 Genetic carrier of other disease: Secondary | ICD-10-CM | POA: Diagnosis not present

## 2019-11-29 DIAGNOSIS — O09212 Supervision of pregnancy with history of pre-term labor, second trimester: Secondary | ICD-10-CM

## 2019-11-29 DIAGNOSIS — Z3A23 23 weeks gestation of pregnancy: Secondary | ICD-10-CM

## 2019-11-29 DIAGNOSIS — O30049 Twin pregnancy, dichorionic/diamniotic, unspecified trimester: Secondary | ICD-10-CM

## 2019-11-29 DIAGNOSIS — O24312 Unspecified pre-existing diabetes mellitus in pregnancy, second trimester: Secondary | ICD-10-CM | POA: Diagnosis not present

## 2019-11-29 DIAGNOSIS — O0990 Supervision of high risk pregnancy, unspecified, unspecified trimester: Secondary | ICD-10-CM | POA: Diagnosis present

## 2019-11-29 DIAGNOSIS — O99212 Obesity complicating pregnancy, second trimester: Secondary | ICD-10-CM | POA: Diagnosis not present

## 2019-11-29 DIAGNOSIS — E669 Obesity, unspecified: Secondary | ICD-10-CM | POA: Diagnosis not present

## 2019-11-29 DIAGNOSIS — O30042 Twin pregnancy, dichorionic/diamniotic, second trimester: Secondary | ICD-10-CM

## 2019-12-04 ENCOUNTER — Other Ambulatory Visit: Payer: Self-pay

## 2019-12-04 ENCOUNTER — Ambulatory Visit (INDEPENDENT_AMBULATORY_CARE_PROVIDER_SITE_OTHER): Payer: Medicaid Other | Admitting: Obstetrics & Gynecology

## 2019-12-04 VITALS — BP 100/66 | HR 97

## 2019-12-04 DIAGNOSIS — O0990 Supervision of high risk pregnancy, unspecified, unspecified trimester: Secondary | ICD-10-CM

## 2019-12-04 DIAGNOSIS — O30049 Twin pregnancy, dichorionic/diamniotic, unspecified trimester: Secondary | ICD-10-CM

## 2019-12-04 DIAGNOSIS — Z3685 Encounter for antenatal screening for Streptococcus B: Secondary | ICD-10-CM | POA: Diagnosis not present

## 2019-12-04 DIAGNOSIS — Z8759 Personal history of other complications of pregnancy, childbirth and the puerperium: Secondary | ICD-10-CM

## 2019-12-04 LAB — POCT URINALYSIS DIPSTICK
Bilirubin, UA: NEGATIVE
Glucose, UA: NEGATIVE
Ketones, UA: NEGATIVE
Nitrite, UA: POSITIVE
Protein, UA: POSITIVE — AB
Spec Grav, UA: 1.015 (ref 1.010–1.025)
Urobilinogen, UA: 0.2 E.U./dL
pH, UA: 5 (ref 5.0–8.0)

## 2019-12-04 MED ORDER — NITROFURANTOIN MONOHYD MACRO 100 MG PO CAPS: 100.0000 mg | ORAL_CAPSULE | Freq: Two times a day (BID) | ORAL | 1 refills | Status: DC

## 2019-12-04 NOTE — Progress Notes (Signed)
   PRENATAL VISIT NOTE  Subjective:  Alexis Ali is a 34 y.o. G3P1011 at [redacted]w[redacted]d being seen today for ongoing prenatal care.  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  Patient reports heartburn and has fair control with antacids.  Contractions: Not present. Vag. Bleeding: None.  Movement: Present. Denies leaking of fluid.   The following portions of the patient's history were reviewed and updated as appropriate: allergies, current medications, past family history, past medical history, past social history, past surgical history and problem list.   Objective:   Vitals:   12/04/19 1019  BP: 100/66  Pulse: 97    Fetal Status: Fetal Heart Rate (bpm): 143/156   Movement: Present     General:  Alert, oriented and cooperative. Patient is in no acute distress.  Skin: Skin is warm and dry. No rash noted.   Cardiovascular: Normal heart rate noted  Respiratory: Normal respiratory effort, no problems with respiration noted  Abdomen: Soft, gravid, appropriate for gestational age.  Pain/Pressure: Absent     Pelvic: Cervical exam deferred        Extremities: Normal range of motion.     Mental Status: Normal mood and affect. Normal behavior. Normal judgment and thought content.   Assessment and Plan:  Pregnancy: G3P1011 at [redacted]w[redacted]d 1. Supervision of high risk pregnancy, antepartum U/A c/w UTI - POCT Urinalysis Dipstick - nitrofurantoin, macrocrystal-monohydrate, (MACROBID) 100 MG capsule; Take 1 capsule (100 mg total) by mouth 2 (two) times daily.  Dispense: 14 capsule; Refill: 1 - Culture, OB Urine  2. Dichorionic diamniotic  twin pregnancy, antepartum F/u growth [place] is ordered  3. History of IUFD MFM f/u  Preterm labor symptoms and general obstetric precautions including but not limited to vaginal bleeding, contractions, leaking of fluid and fetal movement were reviewed in detail with the patient. Please refer to After Visit Summary for other counseling recommendations.   Return in about 2 weeks (around 12/18/2019) for 2 hr.  Future Appointments  Date Time Provider Department Center  12/18/2019  9:15 AM [facility] LAB [facility] None  12/18/2019  9:45 AM Hermina Staggers, MD [facility] None  12/27/2019 10:45 AM [facility]SE [facility]  12/27/2019 11:00 AM [facility] US1 [facility]    Scheryl Darter, MD

## 2019-12-04 NOTE — Patient Instructions (Signed)

## 2019-12-04 NOTE — Progress Notes (Signed)
Pt. Has large amount of leukocytes present in urine with a trace of protein and positive nitrates. Moderate amount of blood.

## 2019-12-07 LAB — URINE CULTURE, OB REFLEX

## 2019-12-07 LAB — CULTURE, OB URINE

## 2019-12-18 ENCOUNTER — Other Ambulatory Visit: Payer: Self-pay

## 2019-12-18 ENCOUNTER — Ambulatory Visit (INDEPENDENT_AMBULATORY_CARE_PROVIDER_SITE_OTHER): Payer: Medicaid Other | Admitting: Obstetrics and Gynecology

## 2019-12-18 ENCOUNTER — Other Ambulatory Visit: Payer: Medicaid Other

## 2019-12-18 ENCOUNTER — Encounter: Payer: Self-pay | Admitting: Obstetrics and Gynecology

## 2019-12-18 VITALS — BP 114/75 | HR 108 | Wt 248.3 lb

## 2019-12-18 DIAGNOSIS — O30049 Twin pregnancy, dichorionic/diamniotic, unspecified trimester: Secondary | ICD-10-CM

## 2019-12-18 DIAGNOSIS — Z8759 Personal history of other complications of pregnancy, childbirth and the puerperium: Secondary | ICD-10-CM

## 2019-12-18 DIAGNOSIS — O24119 Pre-existing diabetes mellitus, type 2, in pregnancy, unspecified trimester: Secondary | ICD-10-CM

## 2019-12-18 DIAGNOSIS — O0990 Supervision of high risk pregnancy, unspecified, unspecified trimester: Secondary | ICD-10-CM

## 2019-12-18 DIAGNOSIS — D563 Thalassemia minor: Secondary | ICD-10-CM

## 2019-12-18 MED ORDER — ACCU-CHEK GUIDE W/DEVICE KIT: 1.0000 | PACK | Freq: Four times a day (QID) | 0 refills | Status: DC

## 2019-12-18 MED ORDER — ACCU-CHEK GUIDE VI STRP: ORAL_STRIP | 12 refills | Status: DC

## 2019-12-18 MED ORDER — ACCU-CHEK SOFTCLIX LANCETS MISC
12 refills | Status: DC
Start: 2019-12-18 — End: 2020-03-20

## 2019-12-18 MED ORDER — PANTOPRAZOLE SODIUM 20 MG PO TBEC: 20.0000 mg | DELAYED_RELEASE_TABLET | Freq: Every day | ORAL | 5 refills | Status: DC

## 2019-12-18 NOTE — Patient Instructions (Signed)
Gestational Diabetes Mellitus, Diagnosis Gestational diabetes (gestational diabetes mellitus) is a short-term (temporary) form of diabetes that can happen during pregnancy. It goes away after you give birth. It may be caused by one or both of these problems:  Your pancreas does not make enough of a hormone called insulin.  Your body does not respond in a normal way to insulin that it makes. Insulin lets sugars (glucose) go into cells in the body. This gives you energy. If you have diabetes, sugars cannot get into cells. This causes high blood sugar (hyperglycemia). If you get gestational diabetes, you are:  More likely to get it if you get pregnant again.  More likely to develop type 2 diabetes in the future. If gestational diabetes is treated, it may not hurt you or your baby. Your doctor will set treatment goals for you. In general, you should have these blood sugar levels:  After not eating for a long time (fasting): 95 mg/dL (5.3 mmol/L).  After meals (postprandial): ? One hour after a meal: at or below 140 mg/dL (7.8 mmol/L). ? Two hours after a meal: at or below 120 mg/dL (6.7 mmol/L).  A1c (hemoglobin A1c) level: 6-6.5%. Follow these instructions at home: Questions to ask your doctor   You may want to ask these questions: ? Do I need to meet with a diabetes educator? ? What equipment will I need to care for myself at home? ? What medicines do I need? When should I take them? ? How often do I need to check my blood sugar? ? What number can I call if I have questions? ? When is my next doctor's visit? General instructions  Take over-the-counter and prescription medicines only as told by your doctor.  Stay at a healthy weight during pregnancy.  Keep all follow-up visits as told by your doctor. This is important. Contact a doctor if:  Your blood sugar is at or above 240 mg/dL (13.3 mmol/L).  Your blood sugar is at or above 200 mg/dL (11.1 mmol/L) and you have ketones in  your pee (urine).  You have been sick or have had a fever for 2 days or more and you are not getting better.  You have any of these problems for more than 6 hours: ? You cannot eat or drink. ? You feel sick to your stomach (nauseous). ? You throw up (vomit). ? You have watery poop (diarrhea). Get help right away if:  Your blood sugar is lower than 54 mg/dL (3 mmol/L).  You get confused.  You have trouble: ? Thinking clearly. ? Breathing.  Your baby moves less than normal.  You have any of these: ? Moderate or large ketone levels in your pee. ? Blood coming from your vagina. ? Unusual fluid coming from your vagina. ? Early contractions. These may feel like tightness in your belly. Summary  Gestational diabetes is a short-term form of diabetes. It can happen while you are pregnant. It goes away after you give birth.  If gestational diabetes is treated, it may not hurt you or your baby. Your doctor will set treatment goals for you.  Keep all follow-up visits as told by your doctor. This is important. This information is not intended to replace advice given to you by your health care provider. Make sure you discuss any questions you have with your health care provider. Document Revised: 05/20/2017 Document Reviewed: 05/17/2015 Elsevier Patient Education  2020 [employer]  

## 2019-12-18 NOTE — Progress Notes (Signed)
Subjective:  Alexis Ali is a 34 y.o. G3P1011 at [redacted]w[redacted]d being seen today for ongoing prenatal care.  She is currently monitored for the following issues for this high-risk pregnancy and has Type 2 diabetes mellitus affecting pregnancy, antepartum; Alpha thalassemia silent carrier (aa/a-); Monoallelic mutation of SMN1 gene; Supervision of high risk pregnancy, antepartum; History of IUFD; Obesity in pregnancy, antepartum; and Pregnancy, twins, antepartum on their problem list.  Patient reports heartburn.  Contractions: Not present. Vag. Bleeding: None.  Movement: Present. Denies leaking of fluid.   The following portions of the patient's history were reviewed and updated as appropriate: allergies, current medications, past family history, past medical history, past social history, past surgical history and problem list. Problem list updated.  Objective:   Vitals:   12/18/19 0858  BP: 114/75  Pulse: (!) 108  Weight: 248 lb 4.8 oz (112.6 kg)    Fetal Status: Fetal Heart Rate (bpm): 132/152   Movement: Present     General:  Alert, oriented and cooperative. Patient is in no acute distress.  Skin: Skin is warm and dry. No rash noted.   Cardiovascular: Normal heart rate noted  Respiratory: Normal respiratory effort, no problems with respiration noted  Abdomen: Soft, gravid, appropriate for gestational age. Pain/Pressure: Absent     Pelvic:  Cervical exam deferred        Extremities: Normal range of motion.  Edema: Trace  Mental Status: Normal mood and affect. Normal behavior. Normal judgment and thought content.   Urinalysis:      Assessment and Plan:  Pregnancy: G3P1011 at [redacted]w[redacted]d  1. Supervision of high risk pregnancy, antepartum Stable - CBC - HIV Antibody (routine testing w rflx) - RPR  2. Dichorionic diamniotic twin pregnancy, antepartum Normal growth, follow up growth scan scheduled  3. History of IUFD Monitoring as per MFM  4. Alpha thalassemia silent carrier (aa/a-) Partner  testing has been recommended  5. Type 2 diabetes mellitus affecting pregnancy, antepartum Discussed with pt. Pt has only had testing during pregnancy CBG testing reviewed including goals and appropriate referrals made. Diet and exersize discussed Will follow CBG's for now - Referral to Nutrition and Diabetes Services - Ambulatory referral to Ophthalmology  Preterm labor symptoms and general obstetric precautions including but not limited to vaginal bleeding, contractions, leaking of fluid and fetal movement were reviewed in detail with the patient. Please refer to After Visit Summary for other counseling recommendations.  Return in about 2 weeks (around 01/01/2020) for face to face, MD only, OB visit.   Hermina Staggers, MD

## 2019-12-18 NOTE — Progress Notes (Signed)
Pt reports good fetal movement, denies pain. Pt states she forgot about 2hr test today and ate breakfast. Pt requests rx for acid reflux.

## 2019-12-19 LAB — CBC
Hematocrit: 32.8 % — ABNORMAL LOW (ref 34.0–46.6)
Hemoglobin: 10.1 g/dL — ABNORMAL LOW (ref 11.1–15.9)
MCH: 23.5 pg — ABNORMAL LOW (ref 26.6–33.0)
MCHC: 30.8 g/dL — ABNORMAL LOW (ref 31.5–35.7)
MCV: 77 fL — ABNORMAL LOW (ref 79–97)
Platelets: 213 10*3/uL (ref 150–450)
RBC: 4.29 x10E6/uL (ref 3.77–5.28)
RDW: 13.1 % (ref 11.7–15.4)
WBC: 6.5 10*3/uL (ref 3.4–10.8)

## 2019-12-19 LAB — HIV ANTIBODY (ROUTINE TESTING W REFLEX): HIV Screen 4th Generation wRfx: NONREACTIVE

## 2019-12-19 LAB — RPR: RPR Ser Ql: NONREACTIVE

## 2019-12-20 ENCOUNTER — Telehealth: Payer: Self-pay

## 2019-12-20 MED ORDER — FERROUS SULFATE 325 (65 FE) MG PO TABS: 325.0000 mg | ORAL_TABLET | Freq: Two times a day (BID) | ORAL | 1 refills | Status: DC

## 2019-12-20 NOTE — Telephone Encounter (Signed)
Pt notified Iron sent to the pharmacy  Informed pt to continue prenatal vitamins as well

## 2019-12-20 NOTE — Telephone Encounter (Signed)
-----   Message from Hermina Staggers, MD sent at 12/20/2019  9:49 AM EDT ----- Please send in Rx for iron supplement for Ms Butler. Pt is aware.  Casimiro Needle

## 2019-12-25 DIAGNOSIS — O30009 Twin pregnancy, unspecified number of placenta and unspecified number of amniotic sacs, unspecified trimester: Secondary | ICD-10-CM | POA: Diagnosis not present

## 2019-12-25 DIAGNOSIS — O24119 Pre-existing diabetes mellitus, type 2, in pregnancy, unspecified trimester: Secondary | ICD-10-CM | POA: Diagnosis not present

## 2019-12-25 DIAGNOSIS — Q250 Patent ductus arteriosus: Secondary | ICD-10-CM | POA: Diagnosis not present

## 2019-12-26 ENCOUNTER — Other Ambulatory Visit: Payer: Self-pay

## 2019-12-26 ENCOUNTER — Other Ambulatory Visit: Payer: Medicaid Other

## 2019-12-26 DIAGNOSIS — O0990 Supervision of high risk pregnancy, unspecified, unspecified trimester: Secondary | ICD-10-CM

## 2019-12-27 ENCOUNTER — Ambulatory Visit: Payer: Medicaid Other | Attending: Obstetrics

## 2019-12-27 ENCOUNTER — Other Ambulatory Visit: Payer: Self-pay | Admitting: *Deleted

## 2019-12-27 ENCOUNTER — Ambulatory Visit: Payer: Medicaid Other | Admitting: *Deleted

## 2019-12-27 DIAGNOSIS — Z3A27 27 weeks gestation of pregnancy: Secondary | ICD-10-CM | POA: Diagnosis not present

## 2019-12-27 DIAGNOSIS — E669 Obesity, unspecified: Secondary | ICD-10-CM | POA: Diagnosis not present

## 2019-12-27 DIAGNOSIS — O99212 Obesity complicating pregnancy, second trimester: Secondary | ICD-10-CM | POA: Diagnosis not present

## 2019-12-27 DIAGNOSIS — O24312 Unspecified pre-existing diabetes mellitus in pregnancy, second trimester: Secondary | ICD-10-CM

## 2019-12-27 DIAGNOSIS — O09292 Supervision of pregnancy with other poor reproductive or obstetric history, second trimester: Secondary | ICD-10-CM

## 2019-12-27 DIAGNOSIS — O30042 Twin pregnancy, dichorionic/diamniotic, second trimester: Secondary | ICD-10-CM | POA: Diagnosis not present

## 2019-12-27 DIAGNOSIS — O0990 Supervision of high risk pregnancy, unspecified, unspecified trimester: Secondary | ICD-10-CM

## 2019-12-27 DIAGNOSIS — O30049 Twin pregnancy, dichorionic/diamniotic, unspecified trimester: Secondary | ICD-10-CM | POA: Insufficient documentation

## 2019-12-27 DIAGNOSIS — Z148 Genetic carrier of other disease: Secondary | ICD-10-CM

## 2019-12-27 DIAGNOSIS — Z363 Encounter for antenatal screening for malformations: Secondary | ICD-10-CM

## 2019-12-27 DIAGNOSIS — O321XX1 Maternal care for breech presentation, fetus 1: Secondary | ICD-10-CM | POA: Diagnosis not present

## 2019-12-27 DIAGNOSIS — O322XX2 Maternal care for transverse and oblique lie, fetus 2: Secondary | ICD-10-CM

## 2019-12-27 LAB — CBC
Hematocrit: 32.1 % — ABNORMAL LOW (ref 34.0–46.6)
Hemoglobin: 9.8 g/dL — ABNORMAL LOW (ref 11.1–15.9)
MCH: 23 pg — ABNORMAL LOW (ref 26.6–33.0)
MCHC: 30.5 g/dL — ABNORMAL LOW (ref 31.5–35.7)
MCV: 75 fL — ABNORMAL LOW (ref 79–97)
Platelets: 181 10*3/uL (ref 150–450)
RBC: 4.26 x10E6/uL (ref 3.77–5.28)
RDW: 13.5 % (ref 11.7–15.4)
WBC: 4.8 10*3/uL (ref 3.4–10.8)

## 2019-12-27 LAB — GLUCOSE TOLERANCE, 2 HOURS W/ 1HR
Glucose, 1 hour: 139 mg/dL (ref 65–179)
Glucose, 2 hour: 115 mg/dL (ref 65–152)
Glucose, Fasting: 86 mg/dL (ref 65–91)

## 2019-12-27 LAB — HIV ANTIBODY (ROUTINE TESTING W REFLEX): HIV Screen 4th Generation wRfx: NONREACTIVE

## 2019-12-27 LAB — RPR: RPR Ser Ql: NONREACTIVE

## 2020-01-04 ENCOUNTER — Ambulatory Visit (INDEPENDENT_AMBULATORY_CARE_PROVIDER_SITE_OTHER): Payer: Medicaid Other | Admitting: Obstetrics and Gynecology

## 2020-01-04 ENCOUNTER — Encounter: Payer: Self-pay | Admitting: Obstetrics and Gynecology

## 2020-01-04 ENCOUNTER — Other Ambulatory Visit: Payer: Self-pay

## 2020-01-04 VITALS — BP 105/69 | HR 84 | Wt 251.1 lb

## 2020-01-04 DIAGNOSIS — Z8632 Personal history of gestational diabetes: Secondary | ICD-10-CM

## 2020-01-04 DIAGNOSIS — O99210 Obesity complicating pregnancy, unspecified trimester: Secondary | ICD-10-CM

## 2020-01-04 DIAGNOSIS — O30049 Twin pregnancy, dichorionic/diamniotic, unspecified trimester: Secondary | ICD-10-CM

## 2020-01-04 DIAGNOSIS — O0990 Supervision of high risk pregnancy, unspecified, unspecified trimester: Secondary | ICD-10-CM

## 2020-01-04 NOTE — Progress Notes (Signed)
Pt is here for HOB, [redacted]w[redacted]d. Alexis Ali twins. Pt reports she attempting to check her BG levels, but does not know if she is doing it right. Pt has not been to diabetic education class.

## 2020-01-04 NOTE — Progress Notes (Signed)
   PRENATAL VISIT NOTE  Subjective:  Alexis Ali is a 34 y.o. G3P1011 at [redacted]w[redacted]d being seen today for ongoing prenatal care.  She is currently monitored for the following issues for this high-risk pregnancy and has History of gestational diabetes mellitus (GDM); Alpha thalassemia silent carrier (aa/a-); Monoallelic mutation of SMN1 gene; Supervision of high risk pregnancy, antepartum; History of IUFD; Obesity in pregnancy, antepartum; and Pregnancy, twins, antepartum on their problem list.  Patient reports no complaints.  Contractions: Not present. Vag. Bleeding: None.  Movement: Present. Denies leaking of fluid.   The following portions of the patient's history were reviewed and updated as appropriate: allergies, current medications, past family history, past medical history, past social history, past surgical history and problem list.   Objective:   Vitals:   01/04/20 0846  BP: 105/69  Pulse: 84  Weight: 251 lb 1.6 oz (113.9 kg)    Fetal Status: Fetal Heart Rate (bpm): 141/150   Movement: Present     General:  Alert, oriented and cooperative. Patient is in no acute distress.  Skin: Skin is warm and dry. No rash noted.   Cardiovascular: Normal heart rate noted  Respiratory: Normal respiratory effort, no problems with respiration noted  Abdomen: Soft, gravid, appropriate for gestational age.  Pain/Pressure: Absent     Pelvic: Cervical exam deferred        Extremities: Normal range of motion.  Edema: Trace  Mental Status: Normal mood and affect. Normal behavior. Normal judgment and thought content.   Assessment and Plan:  Pregnancy: G3P1011 at [redacted]w[redacted]d 1. Supervision of high risk pregnancy, antepartum Patient is doing well without complaints BTL form signed today as patient is considering sterilization  2. Dichorionic diamniotic twin pregnancy, antepartum Concordant growth on 9/1 ultrasound  3. Obesity in pregnancy, antepartum Continue ASA  4. History of gestational diabetes  mellitus (GDM) Patient with early diagnosis of GDM in previous pregnancy at 14 weeks. Patient did not have diabetes testing in between pregnancies Normal glucola this pregnancy Will monitor CBGs  Patient referred to diabetic education - Referral to Nutrition and Diabetes Services  Preterm labor symptoms and general obstetric precautions including but not limited to vaginal bleeding, contractions, leaking of fluid and fetal movement were reviewed in detail with the patient. Please refer to After Visit Summary for other counseling recommendations.   Return in about 2 weeks (around 01/18/2020) for in person, ROB, High risk.  Future Appointments  Date Time Provider Department Center  01/18/2020 11:15 AM Myles Lipps, RD [facility]  01/24/2020 10:30 AM [facility] NURSE [facility]  01/24/2020 10:45 AM [facility] US5 [facility]    Catalina Antigua, MD

## 2020-01-16 ENCOUNTER — Other Ambulatory Visit: Payer: Self-pay

## 2020-01-16 ENCOUNTER — Encounter: Payer: Medicaid Other | Attending: Obstetrics and Gynecology | Admitting: Registered"

## 2020-01-16 ENCOUNTER — Ambulatory Visit: Payer: Medicaid Other | Admitting: Registered"

## 2020-01-16 DIAGNOSIS — O24119 Pre-existing diabetes mellitus, type 2, in pregnancy, unspecified trimester: Secondary | ICD-10-CM | POA: Diagnosis not present

## 2020-01-16 DIAGNOSIS — E118 Type 2 diabetes mellitus with unspecified complications: Secondary | ICD-10-CM | POA: Diagnosis not present

## 2020-01-16 DIAGNOSIS — Z3A00 Weeks of gestation of pregnancy not specified: Secondary | ICD-10-CM | POA: Insufficient documentation

## 2020-01-16 DIAGNOSIS — Z8632 Personal history of gestational diabetes: Secondary | ICD-10-CM

## 2020-01-16 NOTE — Progress Notes (Signed)
Patient was seen on 01/16/20 for Gestational Diabetes self-management. EDD 03/24/20. Patient states prior history of GDM. Diet history obtained. Patient eats variety of all food groups. Beverages include water, occasionally orange juice.  Patient relies on public transportation for her appointments. Today patient arrived 1 hr early for her visit. RD scheduled next appointment to be on same day as her ultrasound.  The following learning objectives were met by the patient :   States the definition of Gestational Diabetes  States why dietary management is important in controlling blood glucose  Describes the effects of carbohydrates on blood glucose levels  Demonstrates ability to create a balanced meal plan  Demonstrates carbohydrate counting   States when to check blood glucose levels  Demonstrates proper blood glucose monitoring techniques  States the effect of stress and exercise on blood glucose levels  States the importance of limiting caffeine and abstaining from alcohol and smoking  Plan:  Aim for 3 Carbohydrate Choices per meal (45 grams) +/- 1 either way  Aim for 1-2 Carbohydrate Choices per snack Begin reading food labels for Total Carbohydrate of foods If OK with your MD, consider  increasing your activity level by walking, Arm Chair Exercises or other activity daily as tolerated Begin checking Blood Glucose before breakfast and 2 hours after first bite of breakfast, lunch and dinner as directed by MD  Bring Log Book/Sheet and meter to every medical appointment  Baby Scripts: Patient was introduced to [employer] states she prefers to record on glucose log sheet Take medication if directed by MD  Blood glucose monitor given: [employer] Me Lot #[id number] Exp: 01/16/2021 CBG: 195 mg/dL ~2 hrs after breakfast with orange juice  Patient already has a meter, testing occassionally 2 hours after each meal. RD provided another meter because her meter was displaying  errors. Pt reported CBGs: 180s  Patient instructed to monitor glucose levels: FBS: 60 - 95 mg/dl 2 hour: <120 mg/dl  Patient received the following handouts:  Nutrition Diabetes and Pregnancy  Carbohydrate Counting List  Blood glucose Log Sheet  Patient will be seen for follow-up 1 week or as needed.

## 2020-01-17 ENCOUNTER — Other Ambulatory Visit: Payer: Self-pay

## 2020-01-18 ENCOUNTER — Ambulatory Visit (INDEPENDENT_AMBULATORY_CARE_PROVIDER_SITE_OTHER): Payer: Medicaid Other | Admitting: Obstetrics and Gynecology

## 2020-01-18 ENCOUNTER — Other Ambulatory Visit: Payer: Self-pay

## 2020-01-18 ENCOUNTER — Encounter: Payer: Self-pay | Admitting: Obstetrics and Gynecology

## 2020-01-18 ENCOUNTER — Ambulatory Visit: Payer: Medicaid Other | Admitting: Skilled Nursing Facility1

## 2020-01-18 VITALS — BP 103/71 | HR 87 | Wt 251.0 lb

## 2020-01-18 DIAGNOSIS — Z8632 Personal history of gestational diabetes: Secondary | ICD-10-CM

## 2020-01-18 DIAGNOSIS — O99210 Obesity complicating pregnancy, unspecified trimester: Secondary | ICD-10-CM

## 2020-01-18 DIAGNOSIS — O0990 Supervision of high risk pregnancy, unspecified, unspecified trimester: Secondary | ICD-10-CM

## 2020-01-18 DIAGNOSIS — O30049 Twin pregnancy, dichorionic/diamniotic, unspecified trimester: Secondary | ICD-10-CM

## 2020-01-18 DIAGNOSIS — Z23 Encounter for immunization: Secondary | ICD-10-CM

## 2020-01-18 DIAGNOSIS — Z3A30 30 weeks gestation of pregnancy: Secondary | ICD-10-CM

## 2020-01-18 NOTE — Progress Notes (Signed)
Pt presents for ROB Pt reports glucose meter is malfunctioning   New glucose meter given today  Tdap and flu vaccines offered; pt agrees

## 2020-01-18 NOTE — Progress Notes (Signed)
   PRENATAL VISIT NOTE  Subjective:  Alexis Ali is a 34 y.o. G3P1011 at [redacted]w[redacted]d being seen today for ongoing prenatal care.  She is currently monitored for the following issues for this high-risk pregnancy and has History of gestational diabetes mellitus (GDM); Alpha thalassemia silent carrier (aa/a-); Monoallelic mutation of SMN1 gene; Supervision of high risk pregnancy, antepartum; History of IUFD; Obesity in pregnancy, antepartum; and Pregnancy, twins, antepartum on their problem list.  Patient reports no complaints.  Contractions: Not present. Vag. Bleeding: None.  Movement: Present. Denies leaking of fluid.   The following portions of the patient's history were reviewed and updated as appropriate: allergies, current medications, past family history, past medical history, past social history, past surgical history and problem list.   Objective:   Vitals:   01/18/20 0902  BP: 103/71  Pulse: 87  Weight: 251 lb (113.9 kg)    Fetal Status:     Movement: Present     General:  Alert, oriented and cooperative. Patient is in no acute distress.  Skin: Skin is warm and dry. No rash noted.   Cardiovascular: Normal heart rate noted  Respiratory: Normal respiratory effort, no problems with respiration noted  Abdomen: Soft, gravid, appropriate for gestational age.  Pain/Pressure: Present     Pelvic: Cervical exam deferred        Extremities: Normal range of motion.  Edema: Trace  Mental Status: Normal mood and affect. Normal behavior. Normal judgment and thought content.   Assessment and Plan:  Pregnancy: G3P1011 at [redacted]w[redacted]d 1. [redacted] weeks gestation of pregnancy   2. Supervision of high risk pregnancy, antepartum Patient is doing well without complaints Flu and tdap today  3. History of gestational diabetes mellitus (GDM) Normal glucola Random CBG yesterday 195- patient reports she ate immediately prior to her appointment Patient has not been checking CBGs as she was suppose to. New meter  provided today  4. Dichorionic diamniotic twin pregnancy, antepartum Follow up MFM scan as scheduled  5. Obesity in pregnancy, antepartum Continue ASA  Preterm labor symptoms and general obstetric precautions including but not limited to vaginal bleeding, contractions, leaking of fluid and fetal movement were reviewed in detail with the patient. Please refer to After Visit Summary for other counseling recommendations.   Return in about 2 weeks (around 02/01/2020) for in person, ROB, High risk.  Future Appointments  Date Time Provider Department Center  01/24/2020 10:30 AM [facility] NURSE [facility]  01/24/2020 10:45 AM [facility] US5 [facility]  01/24/2020 11:15 AM [facility]    Catalina Antigua, MD

## 2020-01-24 ENCOUNTER — Other Ambulatory Visit: Payer: Self-pay

## 2020-01-24 ENCOUNTER — Ambulatory Visit: Payer: Medicaid Other | Attending: Obstetrics and Gynecology

## 2020-01-24 ENCOUNTER — Telehealth: Payer: Self-pay | Admitting: Registered"

## 2020-01-24 ENCOUNTER — Other Ambulatory Visit: Payer: Self-pay | Admitting: *Deleted

## 2020-01-24 ENCOUNTER — Other Ambulatory Visit: Payer: Medicaid Other

## 2020-01-24 ENCOUNTER — Ambulatory Visit: Payer: Medicaid Other | Admitting: *Deleted

## 2020-01-24 DIAGNOSIS — Z148 Genetic carrier of other disease: Secondary | ICD-10-CM

## 2020-01-24 DIAGNOSIS — E669 Obesity, unspecified: Secondary | ICD-10-CM | POA: Diagnosis not present

## 2020-01-24 DIAGNOSIS — O99213 Obesity complicating pregnancy, third trimester: Secondary | ICD-10-CM

## 2020-01-24 DIAGNOSIS — O30049 Twin pregnancy, dichorionic/diamniotic, unspecified trimester: Secondary | ICD-10-CM

## 2020-01-24 DIAGNOSIS — O0990 Supervision of high risk pregnancy, unspecified, unspecified trimester: Secondary | ICD-10-CM | POA: Insufficient documentation

## 2020-01-24 DIAGNOSIS — O24913 Unspecified diabetes mellitus in pregnancy, third trimester: Secondary | ICD-10-CM | POA: Diagnosis not present

## 2020-01-24 DIAGNOSIS — O09293 Supervision of pregnancy with other poor reproductive or obstetric history, third trimester: Secondary | ICD-10-CM | POA: Diagnosis not present

## 2020-01-24 DIAGNOSIS — Z3A31 31 weeks gestation of pregnancy: Secondary | ICD-10-CM

## 2020-01-24 DIAGNOSIS — O30043 Twin pregnancy, dichorionic/diamniotic, third trimester: Secondary | ICD-10-CM | POA: Diagnosis not present

## 2020-01-24 DIAGNOSIS — E119 Type 2 diabetes mellitus without complications: Secondary | ICD-10-CM | POA: Diagnosis not present

## 2020-01-24 DIAGNOSIS — O09523 Supervision of elderly multigravida, third trimester: Secondary | ICD-10-CM | POA: Diagnosis not present

## 2020-01-24 DIAGNOSIS — Z363 Encounter for antenatal screening for malformations: Secondary | ICD-10-CM

## 2020-01-24 NOTE — Telephone Encounter (Signed)
Patient was scheduled to meet with me after her ultrasound for follow-up to check on blood sugar and potential insulin instruction.  Patient originally thought she would be available to see RD by 11:30.   RD called patient at 12:10 and she had already left the building. Pt states her blood sugar has been okay. Pt states her fasting numbers are 95 or below. When asked if after meals her blood sugar stays below 120 patient hesitated and said it hasn't gone over 120 today. RD encouraged her to call and reschedule her appointment with me if her numbers are going above target range of FBS 95 and post meal 120.

## 2020-02-01 ENCOUNTER — Other Ambulatory Visit: Payer: Self-pay

## 2020-02-01 ENCOUNTER — Encounter: Payer: Self-pay | Admitting: Obstetrics and Gynecology

## 2020-02-01 ENCOUNTER — Ambulatory Visit (INDEPENDENT_AMBULATORY_CARE_PROVIDER_SITE_OTHER): Payer: Medicaid Other | Admitting: Obstetrics and Gynecology

## 2020-02-01 VITALS — BP 118/84 | HR 103 | Wt 253.0 lb

## 2020-02-01 DIAGNOSIS — O0990 Supervision of high risk pregnancy, unspecified, unspecified trimester: Secondary | ICD-10-CM

## 2020-02-01 DIAGNOSIS — O30049 Twin pregnancy, dichorionic/diamniotic, unspecified trimester: Secondary | ICD-10-CM

## 2020-02-01 DIAGNOSIS — Z3A32 32 weeks gestation of pregnancy: Secondary | ICD-10-CM

## 2020-02-01 DIAGNOSIS — O99210 Obesity complicating pregnancy, unspecified trimester: Secondary | ICD-10-CM

## 2020-02-01 MED ORDER — COMFORT FIT MATERNITY SUPP MED MISC: 0 refills | Status: DC

## 2020-02-01 NOTE — Progress Notes (Signed)
Pt presents for ROB Pt forgot to bring CBG readings today and does not remember any values but reports they've been "good."

## 2020-02-01 NOTE — Progress Notes (Signed)
   PRENATAL VISIT NOTE  Subjective:  Alexis Ali is a 34 y.o. G3P1011 at [redacted]w[redacted]d being seen today for ongoing prenatal care.  She is currently monitored for the following issues for this high-risk pregnancy and has History of gestational diabetes mellitus (GDM); Alpha thalassemia silent carrier (aa/a-); Monoallelic mutation of SMN1 gene; Supervision of high risk pregnancy, antepartum; History of IUFD; Obesity in pregnancy, antepartum; and Pregnancy, twins, antepartum on their problem list.  Patient reports backache.  Contractions: Not present. Vag. Bleeding: None.  Movement: Present. Denies leaking of fluid.   The following portions of the patient's history were reviewed and updated as appropriate: allergies, current medications, past family history, past medical history, past social history, past surgical history and problem list.   Objective:   Vitals:   02/01/20 0926  BP: 118/84  Pulse: (!) 103  Weight: 253 lb (114.8 kg)    Fetal Status: Fetal Heart Rate (bpm): 147/133   Movement: Present     General:  Alert, oriented and cooperative. Patient is in no acute distress.  Skin: Skin is warm and dry. No rash noted.   Cardiovascular: Normal heart rate noted  Respiratory: Normal respiratory effort, no problems with respiration noted  Abdomen: Soft, gravid, appropriate for gestational age.  Pain/Pressure: Present     Pelvic: Cervical exam deferred        Extremities: Normal range of motion.  Edema: Trace  Mental Status: Normal mood and affect. Normal behavior. Normal judgment and thought content.   Assessment and Plan:  Pregnancy: G3P1011 at 110w4d 1. [redacted] weeks gestation of pregnancy Patient admits to not monitoring CBGs regularly. She did not bring log or meter Patient unable to recall any fasting or pp values Patient rescheduled to meet with diabetic educator  2. Supervision of high risk pregnancy, antepartum Patient is doing well Rx maternity support belt provided  3. Dichorionic  diamniotic twin pregnancy, antepartum Follow up growth ultrasound Fetal echo rescheduled  4. Obesity in pregnancy, antepartum   Preterm labor symptoms and general obstetric precautions including but not limited to vaginal bleeding, contractions, leaking of fluid and fetal movement were reviewed in detail with the patient. Please refer to After Visit Summary for other counseling recommendations.   Return in about 2 weeks (around 02/15/2020) for in person, ROB, High risk.  Future Appointments  Date Time Provider Department Center  02/15/2020 10:30 AM Lacinda Curvin, Gigi Gin, MD [facility] None  02/22/2020 10:30 AM [facility] NURSE [facility]  02/22/2020 10:45 AM [facility] US4 [facility]    Catalina Antigua, MD

## 2020-02-08 ENCOUNTER — Inpatient Hospital Stay (HOSPITAL_COMMUNITY): Payer: Medicaid Other

## 2020-02-08 ENCOUNTER — Ambulatory Visit (INDEPENDENT_AMBULATORY_CARE_PROVIDER_SITE_OTHER): Payer: Medicaid Other | Admitting: Registered"

## 2020-02-08 ENCOUNTER — Ambulatory Visit (INDEPENDENT_AMBULATORY_CARE_PROVIDER_SITE_OTHER): Payer: Medicaid Other | Admitting: Obstetrics and Gynecology

## 2020-02-08 ENCOUNTER — Encounter (HOSPITAL_COMMUNITY): Payer: Self-pay | Admitting: Obstetrics and Gynecology

## 2020-02-08 ENCOUNTER — Inpatient Hospital Stay (HOSPITAL_COMMUNITY): Payer: Medicaid Other | Admitting: Anesthesiology

## 2020-02-08 ENCOUNTER — Other Ambulatory Visit (HOSPITAL_COMMUNITY)
Admission: RE | Admit: 2020-02-08 | Discharge: 2020-02-08 | Disposition: A | Discharge status: Home / Self Care | Payer: Medicaid Other | Source: Ambulatory Visit | Attending: Obstetrics and Gynecology | Admitting: Obstetrics and Gynecology

## 2020-02-08 ENCOUNTER — Other Ambulatory Visit: Payer: Self-pay

## 2020-02-08 ENCOUNTER — Encounter: Payer: Medicaid Other | Attending: Obstetrics and Gynecology | Admitting: Registered"

## 2020-02-08 ENCOUNTER — Inpatient Hospital Stay (HOSPITAL_COMMUNITY)
Admission: AD | Admit: 2020-02-08 | Discharge: 2020-02-11 | DRG: 783 | Disposition: A | Discharge status: Home / Self Care | Payer: Medicaid Other | Attending: Obstetrics and Gynecology | Admitting: Obstetrics and Gynecology

## 2020-02-08 ENCOUNTER — Encounter: Payer: Self-pay | Admitting: Obstetrics and Gynecology

## 2020-02-08 ENCOUNTER — Encounter (HOSPITAL_COMMUNITY)
Admission: AD | Disposition: A | Discharge status: Home / Self Care | Payer: Self-pay | Source: Home / Self Care | Attending: Obstetrics and Gynecology

## 2020-02-08 VITALS — BP 119/46 | HR 79 | Wt 256.7 lb

## 2020-02-08 DIAGNOSIS — O42913 Preterm premature rupture of membranes, unspecified as to length of time between rupture and onset of labor, third trimester: Secondary | ICD-10-CM | POA: Diagnosis present

## 2020-02-08 DIAGNOSIS — O9081 Anemia of the puerperium: Secondary | ICD-10-CM | POA: Diagnosis not present

## 2020-02-08 DIAGNOSIS — O321XX2 Maternal care for breech presentation, fetus 2: Secondary | ICD-10-CM | POA: Diagnosis not present

## 2020-02-08 DIAGNOSIS — O329XX0 Maternal care for malpresentation of fetus, unspecified, not applicable or unspecified: Secondary | ICD-10-CM | POA: Diagnosis not present

## 2020-02-08 DIAGNOSIS — O24419 Gestational diabetes mellitus in pregnancy, unspecified control: Secondary | ICD-10-CM | POA: Diagnosis present

## 2020-02-08 DIAGNOSIS — Z20822 Contact with and (suspected) exposure to covid-19: Secondary | ICD-10-CM | POA: Diagnosis present

## 2020-02-08 DIAGNOSIS — O30049 Twin pregnancy, dichorionic/diamniotic, unspecified trimester: Secondary | ICD-10-CM | POA: Diagnosis not present

## 2020-02-08 DIAGNOSIS — D62 Acute posthemorrhagic anemia: Secondary | ICD-10-CM | POA: Diagnosis not present

## 2020-02-08 DIAGNOSIS — O321XX1 Maternal care for breech presentation, fetus 1: Secondary | ICD-10-CM

## 2020-02-08 DIAGNOSIS — O0990 Supervision of high risk pregnancy, unspecified, unspecified trimester: Secondary | ICD-10-CM | POA: Diagnosis not present

## 2020-02-08 DIAGNOSIS — E119 Type 2 diabetes mellitus without complications: Secondary | ICD-10-CM | POA: Diagnosis not present

## 2020-02-08 DIAGNOSIS — O24119 Pre-existing diabetes mellitus, type 2, in pregnancy, unspecified trimester: Secondary | ICD-10-CM | POA: Diagnosis present

## 2020-02-08 DIAGNOSIS — Z6837 Body mass index (BMI) 37.0-37.9, adult: Secondary | ICD-10-CM | POA: Diagnosis not present

## 2020-02-08 DIAGNOSIS — O24420 Gestational diabetes mellitus in childbirth, diet controlled: Secondary | ICD-10-CM | POA: Diagnosis present

## 2020-02-08 DIAGNOSIS — O99210 Obesity complicating pregnancy, unspecified trimester: Secondary | ICD-10-CM | POA: Diagnosis present

## 2020-02-08 DIAGNOSIS — O322XX2 Maternal care for transverse and oblique lie, fetus 2: Secondary | ICD-10-CM

## 2020-02-08 DIAGNOSIS — O42013 Preterm premature rupture of membranes, onset of labor within 24 hours of rupture, third trimester: Secondary | ICD-10-CM

## 2020-02-08 DIAGNOSIS — Z3A33 33 weeks gestation of pregnancy: Secondary | ICD-10-CM

## 2020-02-08 DIAGNOSIS — Z302 Encounter for sterilization: Secondary | ICD-10-CM

## 2020-02-08 DIAGNOSIS — O99214 Obesity complicating childbirth: Secondary | ICD-10-CM | POA: Diagnosis present

## 2020-02-08 DIAGNOSIS — Z1589 Genetic susceptibility to other disease: Secondary | ICD-10-CM

## 2020-02-08 DIAGNOSIS — Z3A00 Weeks of gestation of pregnancy not specified: Secondary | ICD-10-CM | POA: Diagnosis not present

## 2020-02-08 DIAGNOSIS — O99213 Obesity complicating pregnancy, third trimester: Secondary | ICD-10-CM

## 2020-02-08 DIAGNOSIS — O09523 Supervision of elderly multigravida, third trimester: Secondary | ICD-10-CM

## 2020-02-08 DIAGNOSIS — O30043 Twin pregnancy, dichorionic/diamniotic, third trimester: Secondary | ICD-10-CM | POA: Diagnosis not present

## 2020-02-08 DIAGNOSIS — Z9119 Patient's noncompliance with other medical treatment and regimen: Secondary | ICD-10-CM | POA: Diagnosis not present

## 2020-02-08 DIAGNOSIS — Z3689 Encounter for other specified antenatal screening: Secondary | ICD-10-CM

## 2020-02-08 DIAGNOSIS — E118 Type 2 diabetes mellitus with unspecified complications: Secondary | ICD-10-CM | POA: Insufficient documentation

## 2020-02-08 DIAGNOSIS — O24425 Gestational diabetes mellitus in childbirth, controlled by oral hypoglycemic drugs: Secondary | ICD-10-CM

## 2020-02-08 DIAGNOSIS — Z148 Genetic carrier of other disease: Secondary | ICD-10-CM

## 2020-02-08 DIAGNOSIS — E669 Obesity, unspecified: Secondary | ICD-10-CM

## 2020-02-08 DIAGNOSIS — O4703 False labor before 37 completed weeks of gestation, third trimester: Secondary | ICD-10-CM | POA: Diagnosis not present

## 2020-02-08 DIAGNOSIS — O24313 Unspecified pre-existing diabetes mellitus in pregnancy, third trimester: Secondary | ICD-10-CM

## 2020-02-08 DIAGNOSIS — Z87891 Personal history of nicotine dependence: Secondary | ICD-10-CM

## 2020-02-08 DIAGNOSIS — Z91199 Patient's noncompliance with other medical treatment and regimen due to unspecified reason: Secondary | ICD-10-CM | POA: Insufficient documentation

## 2020-02-08 DIAGNOSIS — Z9851 Tubal ligation status: Secondary | ICD-10-CM

## 2020-02-08 DIAGNOSIS — Z8759 Personal history of other complications of pregnancy, childbirth and the puerperium: Secondary | ICD-10-CM

## 2020-02-08 DIAGNOSIS — O09293 Supervision of pregnancy with other poor reproductive or obstetric history, third trimester: Secondary | ICD-10-CM

## 2020-02-08 DIAGNOSIS — Z98891 History of uterine scar from previous surgery: Secondary | ICD-10-CM

## 2020-02-08 DIAGNOSIS — D563 Thalassemia minor: Secondary | ICD-10-CM | POA: Diagnosis present

## 2020-02-08 DIAGNOSIS — O30009 Twin pregnancy, unspecified number of placenta and unspecified number of amniotic sacs, unspecified trimester: Secondary | ICD-10-CM | POA: Diagnosis present

## 2020-02-08 LAB — CBC
HCT: 41.1 % (ref 36.0–46.0)
Hemoglobin: 11.8 g/dL — ABNORMAL LOW (ref 12.0–15.0)
MCH: 22.5 pg — ABNORMAL LOW (ref 26.0–34.0)
MCHC: 28.7 g/dL — ABNORMAL LOW (ref 30.0–36.0)
MCV: 78.4 fL — ABNORMAL LOW (ref 80.0–100.0)
Platelets: 168 10*3/uL (ref 150–400)
RBC: 5.24 MIL/uL — ABNORMAL HIGH (ref 3.87–5.11)
RDW: 18 % — ABNORMAL HIGH (ref 11.5–15.5)
WBC: 5.5 10*3/uL (ref 4.0–10.5)
nRBC: 0 % (ref 0.0–0.2)

## 2020-02-08 LAB — POCT URINALYSIS DIP (DEVICE)
Bilirubin Urine: NEGATIVE
Glucose, UA: NEGATIVE mg/dL
Ketones, ur: NEGATIVE mg/dL
Leukocytes,Ua: NEGATIVE
Nitrite: NEGATIVE
Protein, ur: 30 mg/dL — AB
Specific Gravity, Urine: 1.02 (ref 1.005–1.030)
Urobilinogen, UA: 0.2 mg/dL (ref 0.0–1.0)
pH: 6.5 (ref 5.0–8.0)

## 2020-02-08 LAB — URINALYSIS, ROUTINE W REFLEX MICROSCOPIC
Bilirubin Urine: NEGATIVE
Glucose, UA: NEGATIVE mg/dL
Hgb urine dipstick: NEGATIVE
Ketones, ur: NEGATIVE mg/dL
Leukocytes,Ua: NEGATIVE
Nitrite: NEGATIVE
Protein, ur: NEGATIVE mg/dL
Specific Gravity, Urine: 1.006 (ref 1.005–1.030)
pH: 6 (ref 5.0–8.0)

## 2020-02-08 LAB — TYPE AND SCREEN
ABO/RH(D): AB POS
Antibody Screen: NEGATIVE

## 2020-02-08 LAB — GLUCOSE, CAPILLARY
Glucose-Capillary: 72 mg/dL (ref 70–99)
Glucose-Capillary: 89 mg/dL (ref 70–99)

## 2020-02-08 LAB — RESPIRATORY PANEL BY RT PCR (FLU A&B, COVID)
Influenza A by PCR: NEGATIVE
Influenza B by PCR: NEGATIVE
SARS Coronavirus 2 by RT PCR: NEGATIVE

## 2020-02-08 LAB — POCT FERN TEST: POCT Fern Test: POSITIVE

## 2020-02-08 SURGERY — Surgical Case
Anesthesia: Spinal | Wound class: Clean Contaminated

## 2020-02-08 MED ORDER — SODIUM CHLORIDE 0.9 % IV SOLN
2.0000 g | INTRAVENOUS | Status: AC
  Administered 2020-02-08: 2 g via INTRAVENOUS

## 2020-02-08 MED ORDER — METOCLOPRAMIDE HCL 5 MG/ML IJ SOLN
INTRAMUSCULAR | Status: AC
  Filled 2020-02-08: qty 2

## 2020-02-08 MED ORDER — TRANEXAMIC ACID-NACL 1000-0.7 MG/100ML-% IV SOLN
1000.0000 mg | Freq: Once | INTRAVENOUS | Status: AC
  Administered 2020-02-08: 1000 mg via INTRAVENOUS

## 2020-02-08 MED ORDER — FENTANYL CITRATE (PF) 100 MCG/2ML IJ SOLN
50.0000 ug | Freq: Once | INTRAMUSCULAR | Status: AC
  Administered 2020-02-08: 50 ug via INTRAVENOUS
  Filled 2020-02-08: qty 2

## 2020-02-08 MED ORDER — COCONUT OIL OIL: 1.0000 "application " | TOPICAL_OIL | Status: DC | PRN

## 2020-02-08 MED ORDER — OXYCODONE-ACETAMINOPHEN 5-325 MG PO TABS: 2.0000 | ORAL_TABLET | ORAL | Status: DC | PRN

## 2020-02-08 MED ORDER — LACTATED RINGERS IV BOLUS
1000.0000 mL | Freq: Once | INTRAVENOUS | Status: AC
  Administered 2020-02-08: 1000 mL via INTRAVENOUS

## 2020-02-08 MED ORDER — METOCLOPRAMIDE HCL 5 MG/ML IJ SOLN
INTRAMUSCULAR | Status: DC | PRN
  Administered 2020-02-08: 10 mg via INTRAVENOUS

## 2020-02-08 MED ORDER — NIFEDIPINE 10 MG PO CAPS
10.0000 mg | ORAL_CAPSULE | ORAL | Status: DC | PRN
  Administered 2020-02-08 (×2): 10 mg via ORAL
  Filled 2020-02-08 (×2): qty 1

## 2020-02-08 MED ORDER — PHENYLEPHRINE HCL-NACL 20-0.9 MG/250ML-% IV SOLN
INTRAVENOUS | Status: AC
  Filled 2020-02-08: qty 250

## 2020-02-08 MED ORDER — SODIUM CHLORIDE 0.9 % IV SOLN
INTRAVENOUS | Status: AC
  Filled 2020-02-08: qty 2

## 2020-02-08 MED ORDER — FERROUS SULFATE 325 (65 FE) MG PO TABS: 325.0000 mg | ORAL_TABLET | Freq: Two times a day (BID) | ORAL | Status: DC

## 2020-02-08 MED ORDER — SOD CITRATE-CITRIC ACID 500-334 MG/5ML PO SOLN
ORAL | Status: AC
  Filled 2020-02-08: qty 15

## 2020-02-08 MED ORDER — MENTHOL 3 MG MT LOZG: 1.0000 | LOZENGE | OROMUCOSAL | Status: DC | PRN

## 2020-02-08 MED ORDER — BETAMETHASONE SOD PHOS & ACET 6 (3-3) MG/ML IJ SUSP
12.0000 mg | Freq: Once | INTRAMUSCULAR | Status: AC
  Administered 2020-02-08: 12 mg via INTRAMUSCULAR
  Filled 2020-02-08: qty 5

## 2020-02-08 MED ORDER — LIDOCAINE HCL (PF) 1 % IJ SOLN: 30.0000 mL | INTRAMUSCULAR | Status: DC | PRN

## 2020-02-08 MED ORDER — SCOPOLAMINE 1 MG/3DAYS TD PT72
MEDICATED_PATCH | TRANSDERMAL | Status: AC
  Filled 2020-02-08: qty 1

## 2020-02-08 MED ORDER — ONDANSETRON HCL 4 MG/2ML IJ SOLN: 4.0000 mg | Freq: Four times a day (QID) | INTRAMUSCULAR | Status: DC | PRN

## 2020-02-08 MED ORDER — MORPHINE SULFATE (PF) 0.5 MG/ML IJ SOLN
INTRAMUSCULAR | Status: AC
  Filled 2020-02-08: qty 10

## 2020-02-08 MED ORDER — FENTANYL CITRATE (PF) 100 MCG/2ML IJ SOLN
INTRAMUSCULAR | Status: DC | PRN
  Administered 2020-02-08: 15 ug via INTRATHECAL

## 2020-02-08 MED ORDER — KETOROLAC TROMETHAMINE 30 MG/ML IJ SOLN
30.0000 mg | Freq: Four times a day (QID) | INTRAMUSCULAR | Status: AC
  Administered 2020-02-09 (×2): 30 mg via INTRAVENOUS
  Filled 2020-02-08 (×2): qty 1

## 2020-02-08 MED ORDER — BETAMETHASONE SOD PHOS & ACET 6 (3-3) MG/ML IJ SUSP: 12.0000 mg | Freq: Once | INTRAMUSCULAR | Status: DC

## 2020-02-08 MED ORDER — SIMETHICONE 80 MG PO CHEW
80.0000 mg | CHEWABLE_TABLET | ORAL | Status: DC
  Administered 2020-02-09 – 2020-02-10 (×2): 80 mg via ORAL
  Filled 2020-02-08 (×2): qty 1

## 2020-02-08 MED ORDER — MORPHINE SULFATE (PF) 0.5 MG/ML IJ SOLN
INTRAMUSCULAR | Status: DC | PRN
Start: 2020-02-08 — End: 2020-02-08
  Administered 2020-02-08: .15 mg via INTRATHECAL

## 2020-02-08 MED ORDER — ONDANSETRON HCL 4 MG/2ML IJ SOLN
4.0000 mg | Freq: Four times a day (QID) | INTRAMUSCULAR | Status: DC | PRN
  Administered 2020-02-09: 4 mg via INTRAVENOUS
  Filled 2020-02-08: qty 2

## 2020-02-08 MED ORDER — MAGNESIUM SULFATE BOLUS VIA INFUSION
6.0000 g | Freq: Once | INTRAVENOUS | Status: AC
  Administered 2020-02-08: 6 g via INTRAVENOUS
  Filled 2020-02-08: qty 1000

## 2020-02-08 MED ORDER — MEPERIDINE HCL 25 MG/ML IJ SOLN: 6.2500 mg | INTRAMUSCULAR | Status: DC | PRN

## 2020-02-08 MED ORDER — LACTATED RINGERS IV SOLN: 500.0000 mL | INTRAVENOUS | Status: DC | PRN

## 2020-02-08 MED ORDER — SOD CITRATE-CITRIC ACID 500-334 MG/5ML PO SOLN
30.0000 mL | ORAL | Status: DC | PRN
  Administered 2020-02-08: 30 mL via ORAL

## 2020-02-08 MED ORDER — IBUPROFEN 800 MG PO TABS
800.0000 mg | ORAL_TABLET | Freq: Four times a day (QID) | ORAL | Status: DC
  Administered 2020-02-09 – 2020-02-11 (×6): 800 mg via ORAL
  Filled 2020-02-08 (×6): qty 1

## 2020-02-08 MED ORDER — LACTATED RINGERS IV SOLN: INTRAVENOUS | Status: DC

## 2020-02-08 MED ORDER — ONDANSETRON HCL 4 MG/2ML IJ SOLN
INTRAMUSCULAR | Status: DC | PRN
  Administered 2020-02-08: 4 mg via INTRAVENOUS

## 2020-02-08 MED ORDER — KETOROLAC TROMETHAMINE 30 MG/ML IJ SOLN
30.0000 mg | Freq: Once | INTRAMUSCULAR | Status: AC | PRN
  Administered 2020-02-08: 30 mg via INTRAVENOUS

## 2020-02-08 MED ORDER — OXYCODONE HCL 5 MG PO TABS: 5.0000 mg | ORAL_TABLET | ORAL | Status: DC | PRN

## 2020-02-08 MED ORDER — TETANUS-DIPHTH-ACELL PERTUSSIS 5-2.5-18.5 LF-MCG/0.5 IM SUSP: 0.5000 mL | Freq: Once | INTRAMUSCULAR | Status: DC

## 2020-02-08 MED ORDER — KETOROLAC TROMETHAMINE 30 MG/ML IJ SOLN
INTRAMUSCULAR | Status: AC
  Filled 2020-02-08: qty 1

## 2020-02-08 MED ORDER — FENTANYL CITRATE (PF) 100 MCG/2ML IJ SOLN: 50.0000 ug | Freq: Once | INTRAMUSCULAR | Status: DC

## 2020-02-08 MED ORDER — PROMETHAZINE HCL 25 MG/ML IJ SOLN: 6.2500 mg | INTRAMUSCULAR | Status: DC | PRN

## 2020-02-08 MED ORDER — WITCH HAZEL-GLYCERIN EX PADS: 1.0000 "application " | MEDICATED_PAD | CUTANEOUS | Status: DC | PRN

## 2020-02-08 MED ORDER — MAGNESIUM SULFATE 40 GM/1000ML IV SOLN: 2.0000 g/h | INTRAVENOUS | Status: DC

## 2020-02-08 MED ORDER — DEXAMETHASONE SODIUM PHOSPHATE 4 MG/ML IJ SOLN
INTRAMUSCULAR | Status: DC | PRN
  Administered 2020-02-08: 4 mg via INTRAVENOUS

## 2020-02-08 MED ORDER — MAGNESIUM SULFATE 40 GM/1000ML IV SOLN
2.0000 g/h | INTRAVENOUS | Status: DC
  Administered 2020-02-08: 2 g/h via INTRAVENOUS
  Filled 2020-02-08: qty 1000

## 2020-02-08 MED ORDER — SODIUM CHLORIDE 0.9 % IV SOLN
INTRAVENOUS | Status: AC
  Filled 2020-02-08: qty 2000

## 2020-02-08 MED ORDER — SCOPOLAMINE 1 MG/3DAYS TD PT72
1.0000 | MEDICATED_PATCH | Freq: Once | TRANSDERMAL | Status: DC
  Administered 2020-02-08: 1.5 mg via TRANSDERMAL

## 2020-02-08 MED ORDER — SODIUM CHLORIDE 0.9 % IV SOLN
INTRAVENOUS | Status: AC
  Filled 2020-02-08: qty 500

## 2020-02-08 MED ORDER — DEXAMETHASONE SODIUM PHOSPHATE 4 MG/ML IJ SOLN
INTRAMUSCULAR | Status: AC
  Filled 2020-02-08: qty 1

## 2020-02-08 MED ORDER — OXYTOCIN-SODIUM CHLORIDE 30-0.9 UT/500ML-% IV SOLN
INTRAVENOUS | Status: AC
  Filled 2020-02-08: qty 500

## 2020-02-08 MED ORDER — ENOXAPARIN SODIUM 60 MG/0.6ML ~~LOC~~ SOLN
50.0000 mg | SUBCUTANEOUS | Status: DC
  Administered 2020-02-09 – 2020-02-10 (×2): 50 mg via SUBCUTANEOUS
  Filled 2020-02-08 (×2): qty 0.6

## 2020-02-08 MED ORDER — GABAPENTIN 300 MG PO CAPS
300.0000 mg | ORAL_CAPSULE | Freq: Two times a day (BID) | ORAL | Status: DC
  Administered 2020-02-09 – 2020-02-11 (×6): 300 mg via ORAL
  Filled 2020-02-08 (×6): qty 1

## 2020-02-08 MED ORDER — PROMETHAZINE HCL 25 MG/ML IJ SOLN: 25.0000 mg | Freq: Four times a day (QID) | INTRAMUSCULAR | Status: DC | PRN

## 2020-02-08 MED ORDER — ONDANSETRON HCL 4 MG/2ML IJ SOLN
INTRAMUSCULAR | Status: AC
  Filled 2020-02-08: qty 2

## 2020-02-08 MED ORDER — PRENATAL MULTIVITAMIN CH
1.0000 | ORAL_TABLET | Freq: Every day | ORAL | Status: DC
  Administered 2020-02-09 – 2020-02-10 (×2): 1 via ORAL
  Filled 2020-02-08 (×2): qty 1

## 2020-02-08 MED ORDER — MAGNESIUM HYDROXIDE 400 MG/5ML PO SUSP: 30.0000 mL | ORAL | Status: DC | PRN

## 2020-02-08 MED ORDER — OXYTOCIN-SODIUM CHLORIDE 30-0.9 UT/500ML-% IV SOLN: 2.5000 [IU]/h | INTRAVENOUS | Status: AC

## 2020-02-08 MED ORDER — SODIUM CHLORIDE 0.9 % IV SOLN: INTRAVENOUS | Status: DC | PRN

## 2020-02-08 MED ORDER — OXYTOCIN-SODIUM CHLORIDE 30-0.9 UT/500ML-% IV SOLN
INTRAVENOUS | Status: DC | PRN
  Administered 2020-02-08: 30 [IU] via INTRAVENOUS

## 2020-02-08 MED ORDER — SENNOSIDES-DOCUSATE SODIUM 8.6-50 MG PO TABS
2.0000 | ORAL_TABLET | ORAL | Status: DC
  Administered 2020-02-09 – 2020-02-10 (×3): 2 via ORAL
  Filled 2020-02-08 (×2): qty 2

## 2020-02-08 MED ORDER — ACETAMINOPHEN 325 MG PO TABS: 650.0000 mg | ORAL_TABLET | ORAL | Status: DC | PRN

## 2020-02-08 MED ORDER — SODIUM CHLORIDE 0.9 % IV SOLN: 2.0000 g | Freq: Four times a day (QID) | INTRAVENOUS | Status: DC

## 2020-02-08 MED ORDER — SODIUM CHLORIDE 0.9 % IR SOLN
Status: DC | PRN
  Administered 2020-02-08: 1

## 2020-02-08 MED ORDER — ZOLPIDEM TARTRATE 5 MG PO TABS: 5.0000 mg | ORAL_TABLET | Freq: Every evening | ORAL | Status: DC | PRN

## 2020-02-08 MED ORDER — DIPHENHYDRAMINE HCL 50 MG/ML IJ SOLN
INTRAMUSCULAR | Status: AC
  Filled 2020-02-08: qty 1

## 2020-02-08 MED ORDER — SIMETHICONE 80 MG PO CHEW: 80.0000 mg | CHEWABLE_TABLET | ORAL | Status: DC | PRN

## 2020-02-08 MED ORDER — BUPIVACAINE IN DEXTROSE 0.75-8.25 % IT SOLN
INTRATHECAL | Status: DC | PRN
  Administered 2020-02-08: 1.6 mL via INTRATHECAL

## 2020-02-08 MED ORDER — DIPHENHYDRAMINE HCL 50 MG/ML IJ SOLN
INTRAMUSCULAR | Status: DC | PRN
  Administered 2020-02-08: 25 mg via INTRAVENOUS

## 2020-02-08 MED ORDER — TRAMADOL HCL 50 MG PO TABS: 50.0000 mg | ORAL_TABLET | Freq: Four times a day (QID) | ORAL | Status: DC | PRN

## 2020-02-08 MED ORDER — PHENYLEPHRINE 40 MCG/ML (10ML) SYRINGE FOR IV PUSH (FOR BLOOD PRESSURE SUPPORT)
PREFILLED_SYRINGE | INTRAVENOUS | Status: AC
  Filled 2020-02-08: qty 10

## 2020-02-08 MED ORDER — SODIUM CHLORIDE 0.9 % IV SOLN
500.0000 mg | INTRAVENOUS | Status: AC
  Administered 2020-02-08: 500 mg via INTRAVENOUS
  Filled 2020-02-08: qty 500

## 2020-02-08 MED ORDER — HYDROMORPHONE HCL 1 MG/ML IJ SOLN: 0.2500 mg | INTRAMUSCULAR | Status: DC | PRN

## 2020-02-08 MED ORDER — DIPHENHYDRAMINE HCL 25 MG PO CAPS: 25.0000 mg | ORAL_CAPSULE | Freq: Four times a day (QID) | ORAL | Status: DC | PRN

## 2020-02-08 MED ORDER — DIBUCAINE (PERIANAL) 1 % EX OINT: 1.0000 "application " | TOPICAL_OINTMENT | CUTANEOUS | Status: DC | PRN

## 2020-02-08 MED ORDER — MEASLES, MUMPS & RUBELLA VAC IJ SOLR: 0.5000 mL | Freq: Once | INTRAMUSCULAR | Status: DC

## 2020-02-08 MED ORDER — TRANEXAMIC ACID-NACL 1000-0.7 MG/100ML-% IV SOLN
INTRAVENOUS | Status: AC
  Filled 2020-02-08: qty 100

## 2020-02-08 MED ORDER — HYDROMORPHONE HCL 1 MG/ML IJ SOLN: 1.0000 mg | INTRAMUSCULAR | Status: DC | PRN

## 2020-02-08 MED ORDER — PHENYLEPHRINE HCL-NACL 20-0.9 MG/250ML-% IV SOLN
INTRAVENOUS | Status: DC | PRN
  Administered 2020-02-08: 60 ug/min via INTRAVENOUS

## 2020-02-08 MED ORDER — OXYCODONE-ACETAMINOPHEN 5-325 MG PO TABS: 1.0000 | ORAL_TABLET | ORAL | Status: DC | PRN

## 2020-02-08 MED ORDER — FENTANYL CITRATE (PF) 100 MCG/2ML IJ SOLN
INTRAMUSCULAR | Status: AC
  Filled 2020-02-08: qty 2

## 2020-02-08 SURGICAL SUPPLY — 31 items
CHLORAPREP W/TINT 26ML (MISCELLANEOUS) ×3 IMPLANT
CLAMP CORD UMBIL (MISCELLANEOUS) IMPLANT
CLOTH BEACON ORANGE TIMEOUT ST (SAFETY) ×3 IMPLANT
DRSG OPSITE POSTOP 4X10 (GAUZE/BANDAGES/DRESSINGS) ×3 IMPLANT
ELECT REM PT RETURN 9FT ADLT (ELECTROSURGICAL) ×3
ELECTRODE REM PT RTRN 9FT ADLT (ELECTROSURGICAL) ×1 IMPLANT
EXTRACTOR VACUUM M CUP 4 TUBE (SUCTIONS) IMPLANT
EXTRACTOR VACUUM M CUP 4' TUBE (SUCTIONS)
GLOVE BIOGEL PI IND STRL 7.0 (GLOVE) ×3 IMPLANT
GLOVE BIOGEL PI INDICATOR 7.0 (GLOVE) ×6
GLOVE ECLIPSE 7.0 STRL STRAW (GLOVE) ×3 IMPLANT
GOWN STRL REUS W/TWL LRG LVL3 (GOWN DISPOSABLE) ×6 IMPLANT
KIT ABG SYR 3ML LUER SLIP (SYRINGE) IMPLANT
NDL HYPO 25X5/8 SAFETYGLIDE (NEEDLE) ×1 IMPLANT
NEEDLE HYPO 22GX1.5 SAFETY (NEEDLE) ×3 IMPLANT
NEEDLE HYPO 25X5/8 SAFETYGLIDE (NEEDLE) ×3 IMPLANT
NS IRRIG 1000ML POUR BTL (IV SOLUTION) ×3 IMPLANT
PACK C SECTION WH (CUSTOM PROCEDURE TRAY) ×3 IMPLANT
PAD ABD 7.5X8 STRL (GAUZE/BANDAGES/DRESSINGS) ×3 IMPLANT
PAD OB MATERNITY 4.3X12.25 (PERSONAL CARE ITEMS) ×3 IMPLANT
PENCIL SMOKE EVAC W/HOLSTER (ELECTROSURGICAL) ×3 IMPLANT
RTRCTR C-SECT PINK 25CM LRG (MISCELLANEOUS) IMPLANT
SUT PDS AB 0 CTX 36 PDP370T (SUTURE) IMPLANT
SUT PLAIN 2 0 XLH (SUTURE) IMPLANT
SUT VIC AB 0 CTX 36 (SUTURE) ×6
SUT VIC AB 0 CTX36XBRD ANBCTRL (SUTURE) ×2 IMPLANT
SUT VIC AB 4-0 KS 27 (SUTURE) ×3 IMPLANT
SYR CONTROL 10ML LL (SYRINGE) ×3 IMPLANT
TOWEL OR 17X24 6PK STRL BLUE (TOWEL DISPOSABLE) ×3 IMPLANT
TRAY FOLEY W/BAG SLVR 14FR LF (SET/KITS/TRAYS/PACK) ×3 IMPLANT
WATER STERILE IRR 1000ML POUR (IV SOLUTION) ×3 IMPLANT

## 2020-02-08 NOTE — Anesthesia Preprocedure Evaluation (Addendum)
Anesthesia Evaluation  Patient identified by MRN, date of birth, ID band Patient awake    Reviewed: Allergy & Precautions, NPO status , Patient's Chart, lab work & pertinent test results  Airway Mallampati: III  TM Distance: >3 FB     Dental no notable dental hx.    Pulmonary former smoker,    Pulmonary exam normal        Cardiovascular negative cardio ROS Normal cardiovascular exam     Neuro/Psych negative neurological ROS  negative psych ROS   GI/Hepatic Neg liver ROS, GERD  Medicated,  Endo/Other  diabetes, Type obesity  Renal/GU negative Renal ROS  negative genitourinary   Musculoskeletal negative musculoskeletal ROS (+)   Abdominal (+) + obese,   Peds negative pediatric ROS (+)  Hematology negative hematology ROS (+)   Anesthesia Other Findings   Reproductive/Obstetrics (+) Pregnancy                             Anesthesia Physical  Anesthesia Plan  ASA: III  Anesthesia Plan: Spinal   Post-op Pain Management:    Induction: Intravenous  PONV Risk Score and Plan: 3 and Ondansetron, Dexamethasone and Scopolamine patch - Pre-op  Airway Management Planned: Natural Airway and Nasal Cannula  Additional Equipment: None  Intra-op Plan:   Post-operative Plan:   Informed Consent: I have reviewed the patients History and Physical, chart, labs and discussed the procedure including the risks, benefits and alternatives for the proposed anesthesia with the patient or authorized representative who has indicated his/her understanding and acceptance.       Plan Discussed with: CRNA  Anesthesia Plan Comments:         Anesthesia Quick Evaluation

## 2020-02-08 NOTE — Plan of Care (Signed)
  Problem: Education: Goal: Knowledge of condition will improve Outcome: Progressing   Problem: Activity: Goal: Will verbalize the importance of balancing activity with adequate rest periods Outcome: Progressing   Problem: Life Cycle: Goal: Chance of risk for complications during the postpartum period will decrease Outcome: Progressing   Problem: Skin Integrity: Goal: Demonstration of wound healing without infection will improve Outcome: Progressing

## 2020-02-08 NOTE — Transfer of Care (Signed)
Immediate Anesthesia Transfer of Care Note  Patient: Alexis Ali  Procedure(s) Performed: CESAREAN SECTION MULTI-GESTATIONAL WITH TUBAL (N/A )  Patient Location: PACU  Anesthesia Type:Spinal  Level of Consciousness: awake, alert  and oriented  Airway & Oxygen Therapy: Patient Spontanous Breathing  Post-op Assessment: Report given to RN and Post -op Vital signs reviewed and stable  Post vital signs: Reviewed and stable  Last Vitals:  Vitals Value Taken Time  BP 137/53 02/08/20 1907  Temp    Pulse 83 02/08/20 1911  Resp 19 02/08/20 1911  SpO2 98 % 02/08/20 1911  Vitals shown include unvalidated device data.  Last Pain:  Vitals:   02/08/20 1716  TempSrc:   PainSc: 8          Complications: No complications documented.

## 2020-02-08 NOTE — Op Note (Signed)
[place] PROCEDURE DATE: 02/08/2020  PREOPERATIVE DIAGNOSES: Intrauterine pregnancy at [redacted]w[redacted]d weeks gestation; diamniotic twin gestation; preterm spontaneous rupture of membranes; active preterm labor;  malpresentation: breech/transverse; gestational diabetes; undesired fertility  POSTOPERATIVE DIAGNOSES: The same  PROCEDURE: Low Transverse Cesarean Section, Bilateral Salpingectomy  SURGEON:  Dr. [name]  ASSISTANT:  Dr. Mart Piggs  ANESTHESIOLOGIST: Dr. Leilani Able  INDICATIONS: Willie Loy Diegel is a 34 y.o. G3P1011 at [redacted]w[redacted]d here for cesarean section and bilateral tubal sterilization secondary to the indications listed under preoperative diagnoses; please see preoperative note for further details.  The risks of surgery were discussed with the patient including but were not limited to: bleeding which may require transfusion or reoperation; infection which may require antibiotics; injury to bowel, bladder, ureters or other surrounding organs; injury to the fetus; need for additional procedures including hysterectomy in the event of a life-threatening hemorrhage; formation of adhesions; placental abnormalities wth subsequent pregnancies; incisional problems; thromboembolic phenomenon and other postoperative/anesthesia complications.  Patient also desires permanent sterilization.  Other reversible forms of contraception were discussed with patient; she declines all other modalities.   Risks of sterilization procedure discussed with patient including but not limited to: risk of regret, permanence of method, bleeding, infection, injury to surrounding organs and need for additional procedures.  Failure risk of about 1% with increased risk of ectopic gestation if pregnancy occurs was also discussed with patient.  Also discussed possibility of post-tubal pain syndrome. The patient concurred with the proposed plan, giving informed written consent for the procedures.  FINDINGS:  Viable female  and female infants delivered in breech (frank breech, transverse rotated to footling breech) presentation.  Apgars and weights pending at the time of this note, both infants taken to NICU.  Clear amniotic fluid noted for Twin B, Twin A membranes were already ruptured.  Intact placenta, three vessel cord.  Normal uterus, fallopian tubes and ovaries bilaterally. Fallopian tubes were sterilized bilaterally via salpingectomy.  ANESTHESIA: Spinal ESTIMATED BLOOD LOSS: 901 ml URINE OUTPUT:  150 ml SPECIMENS: Placenta and fallopian tubes sent to pathology COMPLICATIONS: None immediate  PROCEDURE IN DETAIL:  The patient preoperatively received intravenous antibiotics and had sequential compression devices applied to her lower extremities.   She was then taken to the operating room where spinal anesthesia was administered and was found to be adequate. She was then placed in a dorsal supine position with a leftward tilt, and prepped and draped in a sterile manner.  A foley catheter was placed into her bladder and attached to constant gravity.  After an adequate timeout was performed, a Pfannenstiel skin incision was made with scalpel  and carried through to the underlying layer of fascia. The fascia was incised in the midline, and this incision was extended bilaterally using the Mayo scissors.  Kocher clamps were applied to the superior aspect of the fascial incision and the underlying rectus muscles were dissected off bluntly.  A similar process was carried out on the inferior aspect of the fascial incision. The rectus muscles were separated in the midline and the peritoneum was entered bluntly. The Alexis self-retaining retractor was introduced into the abdominal cavity.  Attention was turned to the lower uterine segment where a low transverse hysterotomy was made with a scalpel and extended bilaterally bluntly.  The infants were successfully delivered in breech presentation, their cords was clamped and cut after  one minute, and the infants were handed over to the awaiting neonatology team. Uterine massage was then administered, and the placentas delivered intact  with a three-vessel cord. The uterus was then cleared of clots and debris.  The hysterotomy was closed with 0 Vicryl in a running locked fashion, and an imbricating layer was also placed with 0 Vicryl.   Attention was then turned to the patient's uterus, and left fallopian tube was then identified, and the Babcock clamp was then used to grasp the tube. Kelly forceps were placed on the distal portion of the mesosalpinx underneath about 80% of the tube.  This pedicle was double suture ligated with 0 Vicryl, and this large portion of the tube including the fimbriated end was excised.  The right fallopian tube was then identified, doubly ligated, excised in a similar fashion allowing for bilateral salpingectomy.   Good hemostasis was noted overall. The pelvis was cleared of all clot and debris. Hemostasis was confirmed on all surfaces.  The retractor was removed.  The peritoneum was closed with a 0 Vicryl running stitch. The fascia was then closed using 0 PDS in a running fashion.  The subcutaneous layer was irrigated, reapproximated with 2-0 plain gut interrupted stitches, and the skin was closed with a 4-0 Vicryl subcuticular stitch. The patient tolerated the procedure well. Sponge, instrument and needle counts were correct x 3.  She was taken to the recovery room in stable condition.    [name], MD, FACOG Obstetrician & Gynecologist, [facility] for [employer], [facility]

## 2020-02-08 NOTE — Progress Notes (Signed)
Prenatal Visit Note Date: 02/08/2020 Clinic: [facility]  Work in Patient  Subjective:  Alexis Ali is a 34 y.o. G3P1011 at [redacted]w[redacted]d being seen today for ongoing prenatal care.  She is currently monitored for the following issues for this high-risk pregnancy and has Alpha thalassemia silent carrier (aa/a-); Monoallelic mutation of SMN1 gene; Supervision of high risk pregnancy, antepartum; History of IUFD; Obesity in pregnancy, antepartum; Pregnancy, twins, antepartum; GDM (gestational diabetes mellitus); Poor compliance; and BMI 30s on their problem list.  Patient is here to see Marylene Land of DM education because she is not checking her CBGs. Patient is a [facility] patient. I was asked to see her because she is not checking her CBGs   Patient states that since being in clinic today she's had some moderately uncomfortable UCs q31m  She states she has a hard time remembering to take her CBGs.   Contractions: Irritability. Vag. Bleeding: None.  Movement: Present. Denies leaking of fluid.   The following portions of the patient's history were reviewed and updated as appropriate: allergies, current medications, past family history, past medical history, past social history, past surgical history and problem list. Problem list updated.  Objective:   Vitals:   02/08/20 1049  BP: (!) 119/46  Pulse: 79  Weight: 256 lb 11.2 oz (116.4 kg)    Fetal Status: Fetal Heart Rate (bpm): 140/128   Movement: Present  Presentation: Vertex  General:  Alert, oriented and cooperative. Patient is in no acute distress.  Skin: Skin is warm and dry. No rash noted.   Cardiovascular: Normal heart rate noted  Respiratory: Normal respiratory effort, no problems with respiration noted  Abdomen: Soft, gravid, appropriate for gestational age. Pain/Pressure: Present     Pelvic:  Cervical exam performed Dilation: 3 Effacement (%): 80 Station: -2  Extremities: Normal range of motion.   Edema: Moderate pitting, indentation subsides rapidly  Mental Status: Normal mood and affect. Normal behavior. Normal judgment and thought content.   Urinalysis:      Assessment and Plan:  Pregnancy: G3P1011 at [redacted]w[redacted]d  1. Supervision of high risk pregnancy, antepartum Routine care. Swabs obtained since she won't go past 38wks and I checked her cervix. Recommend mau eval to rpt exam and see how she is on EFM to assess in PTL or not. MAU called - Strep Gp B NAA - Cervicovaginal ancillary only( )  2. Gestational diabetes mellitus (GDM) in third trimester, gestational diabetes method of control unspecified Long d/w patient. Her CBG today was 72 after eating a carb heavy breakfast. I told her risk of IUFD and other issues for her and babies with not checking her sugars. I offered her admission but I told her that eventually she would have to go home and check her sugars since a continuous CBG monitor is not an option for her, per Marylene Land. She said that she will try and take more sugars at home and declines admission  3. Poor compliance  4. Dichorionic diamniotic twin pregnancy, antepartum  5. Obesity in pregnancy, antepartum  6. BMI 30s   Preterm labor symptoms and general obstetric precautions including but not limited to vaginal bleeding, contractions, leaking of fluid and fetal movement were reviewed in detail with the patient. Please refer to After Visit Summary for other counseling recommendations.  Has 1wk f/u with [facility] already    Bing, MD

## 2020-02-08 NOTE — Lactation Note (Signed)
This note was copied from a baby's chart. Lactation Consultation Note  Patient Name: Alexis Ali Today's Date: 02/08/2020 Reason for consult: Initial assessment;1st time breastfeeding;NICU baby;Preterm <34wks P3, 4 hour preterm twins in NICU. Per mom, she did not BF her 35 year old son and would like to BF her  twins. Per mom, she receives [facility] in [facility] a Referral was sent for a [facility] loaner DEBP due mom not having a breast pump at home. LC discussed hands on pumping mom was shown how to use DEBP, mom understands to pump every 3 hours for 15 minutes on initial setting. Mom shown how to use DEBP & how to disassemble, clean, & reassemble parts. Mom will do hand expression afterwards to help establish milk supply due to Preterm infants being in NICU ( mother and baby separation). LC discussed hand expression and mom expressed 3 mls of colostrum in bullet that RN will take to NICU. Mom understands to follow NICU infant feeding policy and procedures for pre-term infants. Mom knows to call [facility] services if she has any questions or concerns. Mom was using the DEBP as LC was leaving the room.  Mom made aware of O/P services, breastfeeding support groups, community resources, and our phone # for post-discharge questions.    Maternal Data Formula Feeding for Exclusion: No Has patient been taught Hand Expression?: Yes Does the patient have breastfeeding experience prior to this delivery?: No  Feeding    LATCH Score                   Interventions Interventions: Breast feeding basics reviewed;Hand express;DEBP  Lactation Tools Discussed/Used Tools: Flanges;Pump Flange Size: 27 Breast pump type: Double-Electric Breast Pump WIC Program: Yes Pump Review: Setup, frequency, and cleaning;Milk Storage Initiated by:: Danelle Earthly, IBCLC Date initiated:: 02/08/20   Consult Status Consult Status: Follow-up Date: 02/09/20 Follow-up type: In-patient    Danelle Earthly 02/08/2020, 10:31 PM

## 2020-02-08 NOTE — MAU Note (Signed)
.   Alexis Ali is a 34 y.o. at [redacted]w[redacted]d here in MAU reporting: sent over from the office for contractions and 3cms dilated. Pt denies any VB or LOF Onset of complaint: today Pain score: 6 Vitals:   02/08/20 1220  BP: 120/66  Pulse: 70  Resp: 18  Temp: 98.2 F (36.8 C)     DPO:EUMP A- 139  BABY B 143 Lab orders placed from triage: UA

## 2020-02-08 NOTE — H&P (Addendum)
LABOR AND DELIVERY ADMISSION HISTORY AND PHYSICAL NOTE  Alexis Ali is a 34 y.o. female G85P1011 with di/di twins at 68w4dby LMP presenting for labor. Was 3 cm in the office this morning with uncomfortable contractions. While in MAU her cervix progressed from 3/90/-2 to 4/90/-2 with some bloody show. By ultrasound, baby A is breech & B is transverse. Was given IV fluid bolus & procardia with no improvement in contractions. BMZ was given while in MAU.    She reports positive fetal movement. She denies leakage of fluid or vaginal bleeding.  [facility]  - ?GDM  Past Medical History: Past Medical History:  Diagnosis Date   Depression    Gestational diabetes    History of gestational diabetes mellitus (GDM) 10/03/2018   Diagnosed at 14 weeks, not GDM, but Type II DM  Current Diabetic Medications:  None  '[X]'  Aspirin 81 mg daily after 12 weeks (? A2/B GDM)  For A2/B GDM or higher classes of DM '[X]'  Diabetes Education and Testing Supplies '[ ]'  Nutrition Counsult  '[ ]'  Fetal ECHO after 20 weeks  '[ ]'  Eye exam for retina evaluation   Baseline and surveillance labs (pulled in from EPIC, refresh links as needed)  No results   Vaginal Pap smear, abnormal     Past Surgical History: Past Surgical History:  Procedure Laterality Date   DILATION AND CURETTAGE OF UTERUS     DILATION AND EVACUATION N/A 11/16/2018   Procedure: DILATATION AND EVACUATION;  Surgeon: AOsborne Oman MD;  [place] [facility]  Service: Gynecology;  Laterality: N/A;   OPERATIVE ULTRASOUND N/A 11/16/2018   Procedure: OPERATIVE ULTRASOUND;  Surgeon: AOsborne Oman MD;  [facility]  Service: Gynecology;  Laterality: N/A;    Obstetrical History: OB History     Gravida  3   Para  1   Term  1   Preterm      AB  1   Living  1      SAB  1   TAB      Ectopic      Multiple      Live Births  1            Social History: Social History   Socioeconomic History   Marital status: Single    Spouse name: Not on file   Number of children: 1   Years of education: Not on file   Highest education level: Not on file  Occupational History   Occupation: UNEMPLOYED  Tobacco Use   Smoking status: Former Smoker    Types: Cigars    Quit date: 05/29/2019    Years since quitting: 0.6   Smokeless tobacco: Never Used  Vaping Use   Vaping Use: Never used  Substance and Sexual Activity   Alcohol use: Not Currently   Drug use: Never   Sexual activity: Yes  Other Topics Concern   Not on file  Social History Narrative   Not on file   Social Determinants of Health   Financial Resource Strain:    Difficulty of Paying Living Expenses: Not on file  Food Insecurity: No Food Insecurity   Worried About Running Out of Food in the Last Year: Never true   [place] the Last Year: Never true  Transportation Needs: No Transportation Needs   Lack of Transportation (Medical): No   Lack of Transportation (Non-Medical): No  Physical Activity:  Days of Exercise per Week: Not on file   Minutes of Exercise per Session: Not on file  Stress:    Feeling of Stress : Not on file  Social Connections:    Frequency of Communication with Friends and Family: Not on file   Frequency of Social Gatherings with Friends and Family: Not on file   Attends Religious Services: Not on file   Active Member of Clubs or Organizations: Not on file   Attends Archivist Meetings: Not on file   Marital Status: Not on file    Family History: Family History  Problem Relation Age of Onset   Diabetes Mother    Hypertension Mother    Kidney disease Mother    Stroke Mother    Arthritis Maternal Grandmother     Allergies: No Known Allergies  Medications Prior to Admission  Medication Sig Dispense Refill Last Dose   Accu-Chek Softclix Lancets lancets Use as instructed 100 each 12 02/08/2020 at Unknown  time   aspirin EC 81 MG tablet Take 1 tablet (81 mg total) by mouth daily. 60 tablet 2 02/08/2020 at Unknown time   Blood Glucose Monitoring Suppl (ACCU-CHEK GUIDE) w/Device KIT 1 kit by Does not apply route in the morning, at noon, in the evening, and at bedtime. 1 kit 0 02/08/2020 at Unknown time   ferrous sulfate (FERROUSUL) 325 (65 FE) MG tablet Take 1 tablet (325 mg total) by mouth 2 (two) times daily with a meal. 60 tablet 1 02/08/2020 at Unknown time   glucose blood (ACCU-CHEK GUIDE) test strip Use as instructed 100 each 12 02/08/2020 at Unknown time   pantoprazole (PROTONIX) 20 MG tablet Take 1 tablet (20 mg total) by mouth daily. 30 tablet 5 02/08/2020 at Unknown time   Prenatal Vit-Fe Fumarate-FA (PREPLUS) 27-1 MG TABS Take 1 tablet by mouth daily. 30 tablet 13 02/08/2020 at Unknown time   Blood Pressure Monitoring (BLOOD PRESSURE KIT) DEVI 1 kit by Does not apply route once a week. Check Blood Pressure regularly and record readings into the Babyscripts App.  Large Cuff.  DX O90.0 (Patient not taking: Reported on 01/18/2020) 1 each 0    Elastic Bandages & Supports (COMFORT FIT MATERNITY SUPP MED) MISC Wear daily when ambulating (Patient not taking: Reported on 02/08/2020) 1 each 0      Review of Systems  All systems reviewed and negative except as stated in HPI  Physical Exam Blood pressure 132/64, pulse (!) 102, temperature 98.2 F (36.8 C), resp. rate 20, last menstrual period 06/18/2019, SpO2 100 %, unknown if currently breastfeeding. General appearance: alert and cooperative Lungs: clear to auscultation bilaterally Heart: regular rate and rhythm Abdomen: soft, non-tender; bowel sounds normal Extremities: No calf swelling or tenderness Presentation: A=breech, B=transverse Fetal monitoring: Reactive x 2 Uterine activity: Q2-3 minutes  Dilation: 5 Effacement (%): 90 Station: -2 Exam by:: Jorje Guild NP  Prenatal labs: ABO, Rh: --/--/AB POS (10/14 1300) Antibody: NEG  (10/14 1300) Rubella: 3.67 (05/10 1059) RPR: Non Reactive (08/31 0955)  HBsAg: Negative (05/10 1059)  HIV: Non Reactive (08/31 0955)  GC/Chlamydia: negative GBS:   pending 2 hr gtt: 86/139/115 - being treated as GDM Genetic screening:  normal Anatomy US: normal   Fetal Tracing: Baby A Baseline: 140 Variability: moderate Accelerations: 15x15 Decelerations: mild variable x 1  Baby B Baseline: 135 Variability: moderate Accelerations: 15x15 Decelerations: none  Toco: Q2-3 minutes  Prenatal Transfer Tool  Maternal Diabetes: Yes:  Diabetes Type:  Diet controlled Genetic Screening: Normal  Maternal Ultrasounds/Referrals: Normal Fetal Ultrasounds or other Referrals:  Other:  missed appointment for fetal echo Maternal Substance Abuse:  No Significant Maternal Medications:  None Significant Maternal Lab Results: None  Results for orders placed or performed during the hospital encounter of 02/08/20 (from the past 24 hour(s))  Urinalysis, Routine w reflex microscopic Urine, Clean Catch   Collection Time: 02/08/20 12:32 PM  Result Value Ref Range   Color, Urine STRAW (A) YELLOW   APPearance CLEAR CLEAR   Specific Gravity, Urine 1.006 1.005 - 1.030   pH 6.0 5.0 - 8.0   Glucose, UA NEGATIVE NEGATIVE mg/dL   Hgb urine dipstick NEGATIVE NEGATIVE   Bilirubin Urine NEGATIVE NEGATIVE   Ketones, ur NEGATIVE NEGATIVE mg/dL   Protein, ur NEGATIVE NEGATIVE mg/dL   Nitrite NEGATIVE NEGATIVE   Leukocytes,Ua NEGATIVE NEGATIVE  Type and screen   Collection Time: 02/08/20  1:00 PM  Result Value Ref Range   ABO/RH(D) AB POS    Antibody Screen NEG    Sample Expiration      02/11/2020,2359 Performed at [facility] [address], [place],  [address]   Respiratory Panel by RT PCR (Flu A&B, Covid) - Nasopharyngeal Swab   Collection Time: 02/08/20  2:40 PM   Specimen: Nasopharyngeal Swab  Result Value Ref Range   SARS Coronavirus 2 by RT PCR NEGATIVE NEGATIVE   Influenza A  by PCR NEGATIVE NEGATIVE   Influenza B by PCR NEGATIVE NEGATIVE  Fern Test   Collection Time: 02/08/20  3:48 PM  Result Value Ref Range   POCT Fern Test Positive = ruptured amniotic membanes   Results for orders placed or performed in visit on 02/08/20 (from the past 24 hour(s))  Glucose, capillary   Collection Time: 02/08/20 10:42 AM  Result Value Ref Range   Glucose-Capillary 72 70 - 99 mg/dL  Results for orders placed or performed in visit on 02/08/20 (from the past 24 hour(s))  POCT urinalysis dip (device)   Collection Time: 02/08/20 10:39 AM  Result Value Ref Range   Glucose, UA NEGATIVE NEGATIVE mg/dL   Bilirubin Urine NEGATIVE NEGATIVE   Ketones, ur NEGATIVE NEGATIVE mg/dL   Specific Gravity, Urine 1.020 1.005 - 1.030   Hgb urine dipstick MODERATE (A) NEGATIVE   pH 6.5 5.0 - 8.0   Protein, ur 30 (A) NEGATIVE mg/dL   Urobilinogen, UA 0.2 0.0 - 1.0 mg/dL   Nitrite NEGATIVE NEGATIVE   Leukocytes,Ua NEGATIVE NEGATIVE    Patient Active Problem List   Diagnosis Date Noted   GDM (gestational diabetes mellitus) 02/08/2020   Poor compliance 02/08/2020   BMI 30s 02/08/2020   Pregnancy, twins, antepartum 10/09/2019   History of IUFD 09/04/2019   Obesity in pregnancy, antepartum 09/04/2019   Supervision of high risk pregnancy, antepartum 08/28/2019   Alpha thalassemia silent carrier (aa/a-) 94/17/4081   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    Assessment: Julieana P Anthes is a 34 y.o. G3P1011 at 24w4dhere for preterm labor & PPROM Mag sulfate 6/2 per Dr. ERip HarbourS/p BMZ #1 at [place] NP, [place]10/14/2021, 4:08 PM  OB Attending Pt seen and examined. Discussed with MFM and NICU. OK for admission to L & D for observation of progression of labor. BMZ now and repeat in 12 hours.  Delivery method per attending POC reviewed with pt. Pt verbalized understanding  MArlina Robes MD

## 2020-02-08 NOTE — Discharge Instructions (Signed)

## 2020-02-08 NOTE — Anesthesia Procedure Notes (Signed)
Spinal  Patient location during procedure: OR Start time: 02/08/2020 5:47 PM End time: 02/08/2020 5:50 PM Staffing Performed: anesthesiologist  Anesthesiologist: Leilani Able, MD Preanesthetic Checklist Completed: patient identified, IV checked, site marked, risks and benefits discussed, surgical consent, monitors and equipment checked, pre-op evaluation and timeout performed Spinal Block Patient position: sitting Prep: DuraPrep and site prepped and draped Patient monitoring: continuous pulse ox and blood pressure Approach: midline Location: L3-4 Injection technique: single-shot Needle Needle type: Pencan  Needle gauge: 24 G Needle length: 10 cm Needle insertion depth: 6 cm Assessment Sensory level: T2

## 2020-02-08 NOTE — Progress Notes (Signed)
Patient does indicate transportation needs on screening questionnaire, but worth noting that she does rely on public transportation for her appointments.  Patient was seen on 02/08/20 for follow-up assessment and education for Gestational Diabetes. EDD 03/24/20. Patient states changes to diet/lifestyle including drinking mostly water.  Patient is not testing blood glucose as directed pre breakfast and 2 hours after each meal. Patient states she is too tired.  Patient has several complaints today including increased thirst. Given that pt is not checking her blood sugar and at last visit CBG was 195 mg/dL in visit and pt reported self monitoring 180s, RD requested clinical staff perform POC glucose check. Louisa Second, RN states her CBG 72 mg/dL and no protein in urine.  Pt also c/o feeling more pressure in her lower abdomen starting yesterday, having some urinary incontinence, and when droppd off at bus stop, was having slight pain and a lot of pressure, had to stop to catch breath before she walked up to our building for her visit today. Pt met with MD for evaluation, 1 wk f/u at [facility].    The following learning objectives reviewed during follow-up visit:   Importance of checking blood sugar  Ideas for meal alternatives instead of chips or muffins  Plan:  . Continue drinking water  Consider having a protein shake when you don't feel like eating a meal. The one below is a [employer] brand that should work. Also have something with it like fruit  Go ahead and buy the balanced snack that you saw in the store, Delton See is fine.  When eating fruit have 1 or 2 handfuls at one time and also eat a protein with it such as nuts, cheese, egg, meat.  Keep your blood sugar monitor in the bathroom. Check before you go downstairs for breakfast. Pay attention to what time you start eating breakfast and when you need to use the bathroom, if it is close to 2 hours after eating, check your blood sugar.  Write your numbers down and bring to your doctor's appointments. If you do not write them down, bring your monitor to your doctor's appointment.   Patient instructed to monitor glucose levels: FBS: 60 - 95 mg/dl 2 hour: <120 mg/dl  Patient received the following handouts:  AVS with picture of sample protein drink  Patient will be seen for follow-up in 1 weeks or as needed.

## 2020-02-08 NOTE — Patient Instructions (Addendum)
Consider having a protein shake when you don't feel like eating a meal. The one below is a [employer] brand that should work. Also have something with it like fruit  Go ahead and buy the balanced snack that you saw in the store, [employer] is fine.  When eating fruit have 1 or 2 handfuls at one time and also eat a protein with it such as nuts, cheese, egg, meat.  Keep your blood sugar monitor in the bathroom. Check before you go downstairs for breakfast. Pay attention to what time you start eating breakfast and when you need to use the bathroom, if it is close to 2 hours after eating, check your blood sugar. Write your numbers down  and bring to your doctor's appointments. If you do not write them down, bring your monitor to your doctor's appointment.

## 2020-02-08 NOTE — Discharge Summary (Signed)
Postpartum Discharge Summary  Date of Service updated     Patient Name: Alexis Ali DOB: 21-Aug-1985 MRN: [id number]  Date of admission: 02/08/2020 Delivery date:10/14   Tajana, Crotteau [[id number]]  02/08/2020    Tiaira, Arambula [[id number]]  02/08/2020   Delivering provider:    Rex Kras, Boy Kaneshia [[id number]]  Zarianna, Dicarlo [[id number]]  Verita Schneiders A   Date of discharge: 02/08/2020  Admitting diagnosis: Delayed delivery after SROM (spontaneous rupture of membranes) [O42.90] Intrauterine pregnancy: [redacted]w[redacted]d    Secondary diagnosis:  Principal Problem:   S/P cesarean section Active Problems:   Alpha thalassemia silent carrier (aa/a-)   Monoallelic mutation of SMN1 gene   Supervision of high risk pregnancy, antepartum   History of IUFD   Obesity in pregnancy, antepartum   Pregnancy, twins, antepartum   GDM (gestational diabetes mellitus)   Preterm labor with preterm delivery   Cesarean delivery delivered   History of bilateral tubal ligation  Additional problems: none    Discharge diagnosis: Preterm Pregnancy Delivered and GDM A1 vs type 2 DM?                                             Post partum procedures:postpartum tubal ligation Augmentation: N/A Complications: None  Hospital course: Onset of Labor With Unplanned C/S   34y.o. yo G3P1011 at 369w4das admitted in [facility] 02/08/2020. Patient had a labor course significant for patient presented to clinic with increasing pain/pressure on 10/14 and was found to be dilated at that time. She was sent to the MAU for further monitoring and evaluation and was found to be making cervical change. She also had SROM 10/14 _0 . The patient [facility] and twin B-transverse. Delivery details as follows: Membrane Rupture Time/Date:   Details of operation can be found in separate operative note. Patient had an uncomplicated  postpartum course.  She is ambulating,tolerating a regular diet, passing flatus, and urinating well.  Patient is discharged home in stable condition 02/08/20.   Magnesium Sulfate received: Yes: Neuroprotection, received approx 4 hours BMZ received: Yes x1, 5 hours prior to delivery Rhophylac:N/A MMR:N/A T-DaP:Given prenatally Flu: Yes Transfusion:No  Physical exam  Vitals:   02/08/20 1605 02/08/20 1630 02/08/20 1711 02/08/20 1716  BP:   137/78   Pulse:   (!) 110   Resp:  20 18   Temp:   98.2 F (36.8 C)   TempSrc:   Oral   SpO2: 99% 100%    Weight:    105.2 kg  Height:    _1  (1.549 m)   General: alert, cooperative and no distress Lochia: appropriate Uterine Fundus: firm Incision: Healing well with no significant drainage DVT Evaluation: No evidence of DVT seen on physical exam. Labs: Lab Results  Component Value Date   WBC 5.5 02/08/2020   HGB 11.8 (L) 02/08/2020   HCT 41.1 02/08/2020   MCV 78.4 (L) 02/08/2020   PLT 168 02/08/2020   CMP Latest Ref Rng & Units 09/04/2019  Glucose 65 - 99 mg/dL 113(H)  BUN 6 - 20 mg/dL 11  Creatinine 0.57 - 1.00 mg/dL 0.69  Sodium 134 - 144 mmol/L 138  Potassium 3.5 - 5.2 mmol/L 3.8  Chloride 96 - 106 mmol/L 105  CO2 20 - 29  mmol/L 19(L)  Calcium 8.7 - 10.2 mg/dL 9.6  Total Protein 6.0 - 8.5 g/dL 6.2  Total Bilirubin 0.0 - 1.2 mg/dL <0.2  Alkaline Phos 39 - 117 IU/L 51  AST 0 - 40 IU/L 11  ALT 0 - 32 IU/L 5   Edinburgh Score: Edinburgh Postnatal Depression Scale Screening Tool 12/09/2018  I have been able to laugh and see the funny side of things. 1  I have looked forward with enjoyment to things. 2  I have blamed myself unnecessarily when things went wrong. 2  I have been anxious or worried for no good reason. 2  I have felt scared or panicky for no good reason. 2  Things have been getting on top of me. 1  I have been so unhappy that I have had difficulty sleeping. 1  I have felt sad or miserable. 2  I have been so  unhappy that I have been crying. 2  The thought of harming myself has occurred to me. 0  Edinburgh Postnatal Depression Scale Total 15     After visit meds:     Discharge home in stable condition Infant Feeding: Breast Infant Disposition:NICU Discharge instruction: per After Visit Summary and Postpartum booklet. Activity: Advance as tolerated. Pelvic rest for 6 weeks.  Diet: routine diet Future Appointments: Future Appointments  Date Time Provider [place]  02/15/2020 10:30 AM Constant, Vickii Chafe, MD [place] None  02/22/2020 10:30 AM [facility] NURSE [facility]  02/22/2020 10:45 AM [facility] US4 [facility] [place]   Follow up Visit:   Please schedule this patient for a In person postpartum visit in 4 weeks with the following provider: Any provider. Additional Postpartum F/U:2 hour GTT and Incision check 1 week  High risk pregnancy complicated by: GDM and twin gestation Delivery mode:     Dalaya, Suppa [[id number]]  C-Section, Low Transverse    Jailee, Jaquez [[id number]]  C-Section, Low Transverse   Anticipated Birth Control:  BTL done [facility]   [id number]/[id number] Arrie Senate, MD

## 2020-02-08 NOTE — Progress Notes (Signed)
Faculty Practice OB/GYN Attending Note Patient with frequent contractions and feeling pressure.   Reassuring FHR tracing x 2, category I Dilation: 10 Effacement (%): 90 Cervical Position: Middle Station: -2 Presentation: Alexis Ali Breech Exam by:: Dr. Macon Large Given twins, noncephalic (breech/transverse) presentation of both, cesarean delivery recommended.  The risks of cesarean section were discussed with the patient including but were not limited to: bleeding which may require transfusion or reoperation; infection which may require antibiotics; injury to bowel, bladder, ureters or other surrounding organs; injury to the fetus; need for additional procedures including hysterectomy in the event of a life-threatening hemorrhage; formation of adhesions; placental abnormalities wth subsequent pregnancies; incisional problems; thromboembolic phenomenon and other postoperative/anesthesia complications.  Patient also desires permanent sterilization.  Other reversible forms of contraception were discussed with patient; she declines all other modalities. This will be done either via bilateral salpingectomy or bilateral application of Filshie clips.  Risks of procedure discussed with patient including but not limited to: risk of regret, permanence of method, bleeding, infection, injury to surrounding organs and need for additional procedures.  Failure risk of about 1% with increased risk of ectopic gestation if pregnancy occurs was also discussed with patient.  Also discussed possibility of post-tubal pain syndrome. The patient concurred with the proposed plan, giving informed written consent for the procedures.  NICU, Anesthesia and OR aware.  Preoperative prophylactic antibiotics and SCDs ordered on call to the OR.  To OR when ready.   [name], MD, FACOG Obstetrician & Gynecologist, [facility] for [employer], [facility]

## 2020-02-09 ENCOUNTER — Encounter (HOSPITAL_COMMUNITY): Payer: Self-pay | Admitting: Obstetrics & Gynecology

## 2020-02-09 DIAGNOSIS — D62 Acute posthemorrhagic anemia: Secondary | ICD-10-CM | POA: Diagnosis not present

## 2020-02-09 LAB — CBC
HCT: 31 % — ABNORMAL LOW (ref 36.0–46.0)
Hemoglobin: 9.3 g/dL — ABNORMAL LOW (ref 12.0–15.0)
MCH: 22.6 pg — ABNORMAL LOW (ref 26.0–34.0)
MCHC: 30 g/dL (ref 30.0–36.0)
MCV: 75.4 fL — ABNORMAL LOW (ref 80.0–100.0)
Platelets: 178 10*3/uL (ref 150–400)
RBC: 4.11 MIL/uL (ref 3.87–5.11)
RDW: 17.3 % — ABNORMAL HIGH (ref 11.5–15.5)
WBC: 12.3 10*3/uL — ABNORMAL HIGH (ref 4.0–10.5)
nRBC: 0 % (ref 0.0–0.2)

## 2020-02-09 LAB — GLUCOSE, CAPILLARY: Glucose-Capillary: 145 mg/dL — ABNORMAL HIGH (ref 70–99)

## 2020-02-09 LAB — CERVICOVAGINAL ANCILLARY ONLY
Chlamydia: NEGATIVE
Comment: NEGATIVE
Comment: NORMAL
Neisseria Gonorrhea: NEGATIVE

## 2020-02-09 LAB — CREATININE, SERUM
Creatinine, Ser: 0.64 mg/dL (ref 0.44–1.00)
GFR, Estimated: >60 mL/min (ref >60)

## 2020-02-09 LAB — SYPHILIS: RPR W/REFLEX TO RPR TITER AND TREPONEMAL ANTIBODIES, TRADITIONAL SCREENING AND DIAGNOSIS ALGORITHM: RPR Ser Ql: NONREACTIVE

## 2020-02-09 MED ORDER — POLYSACCHARIDE IRON COMPLEX 150 MG PO CAPS: 150.0000 mg | ORAL_CAPSULE | ORAL | Status: DC

## 2020-02-09 MED ORDER — FERROUS SULFATE 325 (65 FE) MG PO TABS
325.0000 mg | ORAL_TABLET | ORAL | Status: DC
  Administered 2020-02-11: 325 mg via ORAL
  Filled 2020-02-09: qty 1

## 2020-02-09 MED ORDER — FERROUS SULFATE 325 (65 FE) MG PO TABS
325.0000 mg | ORAL_TABLET | Freq: Two times a day (BID) | ORAL | Status: DC
  Administered 2020-02-09: 325 mg via ORAL
  Filled 2020-02-09: qty 1

## 2020-02-09 NOTE — Progress Notes (Addendum)
Postpartum Day 1: Cesarean Delivery and Bilateral Tubal Sterilization at [redacted]w[redacted]d for twins, malpresentation  Subjective: Patient reports tolerating PO and no problems voiding.  Ambulating without difficulty.  Minimal pain. No flatus yet. Baby doing well in NICU.   Objective: Vital signs in last 24 hours: Temp:  [97.6 F (36.4 C)-98.2 F (36.8 C)] 98.2 F (36.8 C) (10/15 0451) Pulse Rate:  [61-120] 120 (10/15 0605) Resp:  [18-22] 18 (10/15 0558) BP: (100-138)/(46-80) 119/77 (10/15 0605) SpO2:  [96 %-100 %] 99 % (10/15 0451) Weight:  [105.2 kg-116.4 kg] 105.2 kg (10/14 1716)  Physical Exam:  General: alert and no distress Lochia: appropriate Uterine Fundus: firm Incision: dressing in place DVT Evaluation: No evidence of DVT seen on physical exam. Negative Homan's sign. No cords or calf tenderness.  Recent Labs    02/08/20 1544 02/09/20 0600  HGB 11.8* 9.3*  HCT 41.1 31.0*   CBG this morning :145 (nonfasting)  Assessment/Plan: Status post Cesarean section.  Mild asymptomatic anemia, oral iron ordered for postoperative anemia secondary to acute postoperative blood loss. Breastfeeding. Continue current care.  [name], MD 02/09/2020, 7:30 AM

## 2020-02-09 NOTE — Lactation Note (Signed)
This note was copied from a baby's chart. Lactation Consultation Note  Patient Name: Heba Ige WEXHB'Z Date: 02/09/2020 Reason for consult: Follow-up assessment;Multiple gestation;NICU baby;1st time breastfeeding;Preterm <34wks;Maternal endocrine disorder Type of Endocrine Disorder?: Diabetes  F/u vist. Assisted pt with pumping. Reinforced previously taught ed regarding pump use, storage, and cleaning. Reviewed pumping and volume expectations on pp day 2. Patient offered the opportunity to ask questions. All concerns addressed. Patient will benefit from continued lactation support. Will plan f/u visit.  Maternal Data Has patient been taught Hand Expression?: Yes Does the patient have breastfeeding experience prior to this delivery?: No  Feeding    LATCH Score                   Interventions Interventions: Breast feeding basics reviewed;Hand express;DEBP  Lactation Tools Discussed/Used Pump Review: Setup, frequency, and cleaning;Milk Storage Initiated by:: LMiller Date initiated:: 02/09/20   Consult Status Consult Status: Follow-up Date: 02/10/20 Follow-up type: In-patient    [name] 02/09/2020, 2:39 PM

## 2020-02-09 NOTE — Lactation Note (Signed)
This note was copied from a baby's chart. Lactation Consultation Note  Patient Name: Alexis Ali Today's Date: 02/09/2020   LC attempted to visit mom but she was in NICU.  Will attempt to visit later today.  Maternal Data    Feeding    LATCH Score                   Interventions    Lactation Tools Discussed/Used     Consult Status      Maryruth Hancock [facility] 02/09/2020, 11:07 AM

## 2020-02-09 NOTE — Clinical Social Work Maternal (Signed)
CLINICAL SOCIAL WORK MATERNAL/CHILD NOTE  Patient Details  Name: Alexis Ali MRN: [id number] Date of Birth: 03/20/1986  Date:  02/09/2020  Clinical Social Worker Initiating Note:  Shantelle Alles, LCSW Date/Time: Initiated:  02/09/20/1436     Child's Name:  Alexis Ali; Alexis Ali   Biological Parents:  Mother, Father (Father: Kellis Ali)   Need for Interpreter:  None   Reason for Referral:  Other (Comment), Behavioral Health Concerns (NICU Admission;)   Address:  [address] [place] [address]    Phone number:  [phone] (home)     Additional phone number:   Household Members/Support Persons (HM/SP):   Household Member/Support Person 1   HM/SP Name Relationship DOB or Age  HM/SP -1 [name] son 06/19/2006  HM/SP -2        HM/SP -3        HM/SP -4        HM/SP -5        HM/SP -6        HM/SP -7        HM/SP -8          Natural Supports (not living in the home):  Immediate Family, Extended Family, Friends   Professional Supports: None   Employment: Unemployed   Type of Work:     Education:  High school graduate   Homebound arranged:    Financial Resources:  Medicaid   Other Resources:  Food Stamps , WIC   Cultural/Religious Considerations Which May Impact Care:    Strengths:  Ability to meet basic needs , Understanding of illness   Psychotropic Medications:         Pediatrician:       Pediatrician List:   [place]    [place]    [place]    [place]    [place]    [place]      Pediatrician Fax Number:    Risk Factors/Current Problems:  Mental Health Concerns    Cognitive State:  Able to Concentrate , Alert , Linear Thinking , Insightful , Goal Oriented    Mood/Affect:  Calm , Interested , Comfortable , Relaxed    CSW Assessment: CSW met with MOB at infants bedside to complete psychosocial assessment and to discuss infants NICU admissions and behavioral health concerns.  CSW introduced self and explained reason for visit. MOB was welcoming, open, pleasant and remained engaged during assessment. MOB reported that she resides with her son and receives both WIC and food stamps. MOB reported that she has started to shop for infants and already has 2 new car seats. MOB reported that FOB is supposed to get basinets for infants. MOB reported that she has bought diapers, wipes and some clothing. CSW informed MOB about [employer] if assistance is needed obtaining items for infants. MOB reported that assistance getting a pack and play and boy clothes would be helpful. CSW agreed to make referral. MOB reported that she hasn't selected a pediatrician and requested a list. CSW provided pediatrician list. CSW inquired about MOB's support system, MOB reported that she has large support system including her FOB, 4 brothers, best friend, aunt and cousins.   CSW inquired about MOB's mental health history. MOB denied any mental health history and denied any postpartum depression with her first child. MOB reported that she experienced depression at the beginning of this pregnancy due to having a miscarriage in 2020. MOB reported that she was nervous regarding   her pregnancy after losing her previous child. CSW acknowledged and validated MOB's feelings associated with her experience. MOB shared that she loss her parents back to back in 2015 and 2016 which was also a trigger for depression because they were involved when MOB had her first child. CSW offered condolences and inquired about MOB's coping with her losses. MOB reported that her support system is helpful and she just has to call them. CSW positively affirmed MOB's helpful support system. MOB reported that she is not taking medication or participating in therapy to treat depression. MOB denied any current depressive symptoms. CSW and MOB discussed MOB's edinburgh score 15. MOB attributed her high edinburgh score  to being nervous and scared about having twins and having her first C-section. MOB reported that her nerves have eased a lot since she gave birth. CSW inquired about how MOB was feeling emotionally since giving birth, MOB reported that she was feeling okay. MOB presented calm and did not demonstrate any acute mental health signs/symptoms. CSW assessed for safety, MOB denied SI, HI and domestic violence.   CSW provided education regarding the baby blues period vs. perinatal mood disorders, discussed treatment and gave resources for mental health follow up if concerns arise.  CSW recommends self-evaluation during the postpartum time period using the New Mom Checklist from Postpartum Progress and encouraged MOB to contact a medical professional if symptoms are noted at any time.    CSW provided review of Sudden Infant Death Syndrome (SIDS) precautions.    CSW and MOB discussed infants NICU admissions. CSW informed MOB about the NICU, what to expect and resources/supports available while infants are admitted to the NICU. MOB reported that she feels well informed about infants care. MOB reported that meal vouchers would be helpful, CSW provided 4 meal vouchers. CSW inquired about any transportation barriers with visiting infants in the NICU. MOB reported that she will probably utilize public transportation to visit with infants. CSW asked if a 31 day bus pass would be helpful, MOB reported yes. CSW provided MOB with a 31 day bus pass. MOB denied any additional needs/concerns regarding the NICU.   CSW made a referral to [employer] for requested items.   CSW will continue to offer resources/supports while infants are admitted to the NICU.   CSW Plan/Description:  Sudden Infant Death Syndrome (SIDS) Education, Perinatal Mood and Anxiety Disorder (PMADs) Education, Other Patient/Family Education, Other Information/Referral to Community Resources    Charma Mocarski L Dawson Hollman, LCSW 02/09/2020, 2:42 PM 

## 2020-02-10 LAB — GLUCOSE, CAPILLARY
Glucose-Capillary: 54 mg/dL — ABNORMAL LOW (ref 70–99)
Glucose-Capillary: 93 mg/dL (ref 70–99)

## 2020-02-10 LAB — STREP GP B NAA: Strep Gp B NAA: NEGATIVE

## 2020-02-10 NOTE — Final Progress Note (Signed)
Pt fasting this morning at 0645 was 54. Snack given and MD Despina Hidden was notified. Cbg was repeated at 720 and it was 93.

## 2020-02-10 NOTE — Progress Notes (Signed)
Subjective: Postpartum Day 2: Cesarean Delivery Patient reports incisional pain.    Objective: Vital signs in last 24 hours: Temp:  [97.9 F (36.6 C)-98.6 F (37 C)] 98 F (36.7 C) (10/16 0427) Pulse Rate:  [57-67] 61 (10/16 0427) Resp:  [17-18] 18 (10/16 0427) BP: (90-131)/(45-60) 94/58 (10/16 0427) SpO2:  [96 %-99 %] 98 % (10/16 0427)  Physical Exam:  General: alert, cooperative and no distress Lochia: appropriate Uterine Fundus: firm Incision: healing well, no significant drainage, no dehiscence, no significant erythema DVT Evaluation: No evidence of DVT seen on physical exam.  Recent Labs    02/08/20 1544 02/09/20 0600  HGB 11.8* 9.3*  HCT 41.1 31.0*    Assessment/Plan: Status post Cesarean section. Doing well postoperatively.  Continue current care. Anticipate discharge tomorrow Lazaro Arms 02/10/2020, 7:26 AM

## 2020-02-10 NOTE — Lactation Note (Signed)
This note was copied from a baby's chart. Lactation Consultation Note  Patient Name: Alexis Ali PIRJJ'O Date: 02/10/2020 Reason for consult: Follow-up assessment, NICU,  Twin infants 33+4 days Mother assist with pumping. She reports she pumped twice yesterday and got a few drops .  Reviewed hand expression with mother and obtained drops .  Mother advised to  Continue to pump every 2-3 hours for 15 min She is active with WIC . Mother has a DEBP sat up at the bedside.    Mother to continue to due STS. Mother is aware of available LC services at [facility], BFSG'S, OP Dept, and phone # for questions or concerns about breastfeeding.  Mother receptive to all teaching and plan of care.     Maternal Data Has patient been taught Hand Expression?: Yes  Feeding Feeding Type: Donor Breast Milk  LATCH Score                   Interventions Interventions: Hand express;Expressed milk;DEBP  Lactation Tools Discussed/Used WIC Program: Yes Pump Review: Setup, frequency, and cleaning;Milk Storage   Consult Status Consult Status: Follow-up Date: 02/11/20 Follow-up type: In-patient    Stevan Born [facility] 02/10/2020, 11:09 AM

## 2020-02-11 ENCOUNTER — Encounter (HOSPITAL_COMMUNITY): Payer: Self-pay | Admitting: *Deleted

## 2020-02-11 MED ORDER — IBUPROFEN 800 MG PO TABS: 800.0000 mg | ORAL_TABLET | Freq: Four times a day (QID) | ORAL | 0 refills | Status: DC

## 2020-02-11 MED ORDER — OXYCODONE HCL 5 MG PO TABS: 5.0000 mg | ORAL_TABLET | ORAL | 0 refills | Status: DC | PRN

## 2020-02-11 NOTE — Lactation Note (Signed)
This note was copied from a baby's chart. Lactation Consultation Note  Patient Name: Alexis Ali Today's Date: 02/11/2020   Discussed treatment and prevention of engorgement. Mother was given the number to [facility] to phone in am. [facility] ref sent on Friday.  Mother was given extra bottles and bm labels. She reports that she pumped more frequently yesterday. Mother denies having any questions or concerns.      Maternal Data    Feeding Feeding Type: Donor Breast Milk  LATCH Score                   Interventions    Lactation Tools Discussed/Used     Consult Status      Michel Bickers 02/11/2020, 9:53 AM

## 2020-02-11 NOTE — Progress Notes (Signed)
Pt ambulated to nicu and then plans to go home  Teaching complete plan to get pump from wic

## 2020-02-12 ENCOUNTER — Encounter (HOSPITAL_COMMUNITY): Payer: Self-pay | Admitting: Obstetrics and Gynecology

## 2020-02-12 ENCOUNTER — Ambulatory Visit: Payer: Self-pay

## 2020-02-12 ENCOUNTER — Telehealth: Payer: Self-pay | Admitting: *Deleted

## 2020-02-12 LAB — SURGICAL PATHOLOGY

## 2020-02-12 NOTE — Lactation Note (Addendum)
This note was copied from a baby's chart. Lactation Consultation Note  Patient Name: Alexis Ali [id number] Date: 02/12/2020 Reason for consult: Follow-up assessment;Late-preterm 34-36.6wks;NICU baby  F/u consult. Provided tube top for hands-free pumping. Mom able to remain skin to skin during pumping. Reviewed importance of frequent pumping: 8 times in 24 hours. Pt used hand pump yesterday and will f/u with WIC for pump. Pt offered opportunity to ask questions. All concerns addressed. Will plan f/u visit.   Consult Status Consult Status: Follow-up Date: 02/12/20 Follow-up type: In-patient    [name] 02/12/2020, 10:51 AM

## 2020-02-12 NOTE — Telephone Encounter (Signed)
Transition Care Management Unsuccessful Follow-up Telephone Call  Date of discharge and from where:  02/11/20 [facility]  Attempts:  1st Attempt  Reason for unsuccessful TCM follow-up call:  Left voice message

## 2020-02-13 ENCOUNTER — Ambulatory Visit: Payer: Self-pay

## 2020-02-13 ENCOUNTER — Other Ambulatory Visit: Payer: Self-pay

## 2020-02-13 NOTE — Lactation Note (Signed)
This note was copied from a baby's chart. Lactation Consultation Note  Patient Name: Alexis Ali Today's Date: 02/13/2020   RN provided Mom with another double pump kit due to Mom misplacing her parts.  Pump set up in babies's room.   Broadus John 02/13/2020, 3:56 PM

## 2020-02-14 NOTE — Telephone Encounter (Signed)
Message will be closed as we are outside of the 48 hour window to contact patient.  

## 2020-02-15 ENCOUNTER — Encounter: Payer: Medicaid Other | Admitting: Obstetrics and Gynecology

## 2020-02-16 ENCOUNTER — Ambulatory Visit (INDEPENDENT_AMBULATORY_CARE_PROVIDER_SITE_OTHER): Payer: Medicaid Other

## 2020-02-16 ENCOUNTER — Other Ambulatory Visit: Payer: Self-pay

## 2020-02-16 DIAGNOSIS — Z4889 Encounter for other specified surgical aftercare: Secondary | ICD-10-CM

## 2020-02-16 NOTE — Progress Notes (Signed)
Pt presents for wound check s/p twin CS-L TV on 02/08/20. Honeycomb dressing was removed without difficulty.  The wound is C/D/I. The patient is alerted to watch for any signs of infection (redness, pus, pain, increased swelling or fever) and call if such occurs. Home wound care instructions are provided.   Keep upcoming 2 gtt and pp visit.

## 2020-02-18 NOTE — Progress Notes (Signed)
Agree with A & P. 

## 2020-02-22 ENCOUNTER — Ambulatory Visit: Payer: Medicaid Other

## 2020-02-23 NOTE — Anesthesia Postprocedure Evaluation (Signed)
Anesthesia Post Note  Patient: Alexis Ali  Procedure(s) Performed: CESAREAN SECTION MULTI-GESTATIONAL WITH TUBAL (N/A )     Patient location during evaluation: PACU Anesthesia Type: Spinal Level of consciousness: awake Pain management: pain level controlled Vital Signs Assessment: post-procedure vital signs reviewed and stable Respiratory status: spontaneous breathing Cardiovascular status: stable Postop Assessment: no headache, no backache, spinal receding, patient able to bend at knees and no apparent nausea or vomiting Anesthetic complications: no   No complications documented.  Last Vitals:  Vitals:   02/11/20 0840 02/11/20 0841  BP:  126/70  Pulse:  74  Resp: 18   Temp: 36.7 C   SpO2: 100%     Last Pain:  Vitals:   02/11/20 0840  TempSrc: Oral  PainSc:    Pain Goal:                   Caren Macadam

## 2020-02-25 ENCOUNTER — Ambulatory Visit: Payer: Self-pay

## 2020-02-25 NOTE — Lactation Note (Signed)
This note was copied from a Alexis's chart. Lactation Consultation Note  Patient Name: Alexis Ali Today's Date: 02/25/2020 Reason for consult: Follow-up assessment;Difficult latch;1st time breastfeeding;NICU Alexis;Infant < 6lbs;Late-preterm 34-36.6wks;Multiple gestation  LC in to assist/assess babies at the breast.  Mom has an abundant milk supply.  Mom pre-pumped right breast to soften and ease the flow for babies.  Alexis Ali has been taking full feeds by bottle.  Both babies awake and cueing.  Recommended to Mom that we use football holds.    Alexis girl B positioned on right breast.  LC assisted Mom in eliciting Alexis to open her mouth wide enough to latch deeply onto breast.  Alexis able to attain a deep latch after a couple attempts.  She stayed on the breast and sucked/swallowed with deep jaw extensions and pauses consistently for 11 minutes.  Alexis came off the breast appearing contented, burped and then RN will offer 1/3 of babies supplement by bottle.  Alexis Alexis A  Ali positioned on left breast that Mom did not pre-pump first.  Alexis latched after a couple attempts but fell asleep and stopped sucking.  Talked to Mom about initiating a nipple shield to stimulate Alexis to suck.  A 20 mm nipple applied instructing Mom on importance of inverting prior to placing to draw nipple into shield.  Alexis latched on immediately and rhythmically sucked and swallowed for 15 mins.  Milk noted in shield after feeding.    Both babies were breastfeeding for about 8 mins together.  Rolled up blanket placed under babies heads to support them.  Mom was thrilled with progress.  Encouraged Mom to pump after breastfeeding to support her full milk supply.   Recommended pre-pumping about half her amount before offering the breast due to Human babies and large milk supply.  Mom understands that this is just temporary as both babies handled milk flow very well.  O2 sats were 100 the entire time.    Interventions Interventions: Breast feeding basics reviewed;Assisted with latch;Skin to skin;Breast massage;Hand express;Pre-pump if needed;Breast compression;Adjust position;Support pillows;Position options;Expressed milk;DEBP  Lactation Tools Discussed/Used Tools: Pump;Nipple Shields Nipple shield size: 20 Breast pump type: Double-Electric Breast Pump   Consult Status Consult Status: Follow-up Date: 02/26/20 Follow-up type: In-patient    Judee Clara 02/25/2020, 11:57 AM

## 2020-02-26 ENCOUNTER — Ambulatory Visit: Payer: Self-pay

## 2020-02-26 NOTE — Lactation Note (Signed)
This note was copied from a baby's chart. Lactation Consultation Note  Patient Name: Alexis Ali JOINO'M Date: 02/26/2020 Reason for consult: Follow-up assessment;NICU baby;Preterm <34wks;Infant < 6lbs  Baby is 77 weeks old, wide awake , and rooting.  Mom latched on the left breast and LC checked lip lines and flipped upper lip to increase flange, and eased chin for depth. LC worked with mom not to allow the baby to nibble her way onto the breast and baby was able to open wide.  Per mom comfortable with entire latch. Baby released on her own and nipple well rounded.  Per mom has been pumping at least 6 times a day and when she visits baby she pumps both breast for 60 mins. Last pumping of the day is at 9:30 -10 p and then resumes pumping at 6:30 am when she pumps both breast.  Per mom is using a #27 F on the left and a #24 F on the right and seems to get more milk on the side she is using the #19F and less with the #24 F .  LC recommended increasing the #24 F to #27 F and see if the volume increases , if not decrease to #24 F .  LC stressed the importance of pumping both breast together to enhance volume.  Per mom using the [facility] loaner at home.  LC also recommended after her 6:30 am pumping if still feeling full after she gets her son on the bus for school to pump at least 10 -15 mins to soften well.  Mom denies any engorgement or sore nipples.  LC praised her for her efforts pumping and breast feeding Twins.    Maternal Data Has patient been taught Hand Expression?: Yes  Feeding Feeding Type: Breast Fed Nipple Type: Dr. Levert Feinstein Preemie  LATCH Score Latch: Grasps breast easily, tongue down, lips flanged, rhythmical sucking.  Audible Swallowing: Spontaneous and intermittent  Type of Nipple: Everted at rest and after stimulation  Comfort (Breast/Nipple): Filling, red/small blisters or bruises, mild/mod discomfort  Hold (Positioning): Assistance needed to correctly position  infant at breast and maintain latch.  LATCH Score: 8  Interventions Interventions: Breast feeding basics reviewed;Assisted with latch;Skin to skin;Breast compression;Adjust position;Support pillows;Position options  Lactation Tools Discussed/Used WIC Program: Yes   Consult Status Consult Status: Follow-up Date: 02/27/20 Follow-up type: In-patient    [name] 02/26/2020, 2:57 PM

## 2020-02-27 ENCOUNTER — Ambulatory Visit: Payer: Self-pay

## 2020-02-27 NOTE — Lactation Note (Signed)
This note was copied from a baby's chart. Lactation Consultation Note  Patient Name: Alexis Ali [id number] Date: 02/27/2020 Reason for consult: Follow-up assessment;NICU baby;Other (Comment) (discharge)  Baby B to d/c today. Mother is mostly pumping and bottle feeding but practiced breastfeeding with baby B yesterday. Baby A to remain in-patient and is continuing to advance po feedings using bottle. He has not challenged at the breast, from mom's recall. This LC offered to assist prn. Parents were offered the opportunity to ask questions and all concerns were addressed.   Feeding Feeding Type: Formula Nipple Type: Dr. Levert Feinstein Preemie   Interventions Interventions: Breast feeding basics reviewed;Position options;DEBP    Consult Status Consult Status: Complete Date: 02/27/20 Follow-up type: In-patient    [name] 02/27/2020, 11:56 AM

## 2020-02-29 ENCOUNTER — Ambulatory Visit: Payer: Self-pay

## 2020-02-29 NOTE — Lactation Note (Signed)
This note was copied from a baby's chart. Lactation Consultation Note  Patient Name: Boy A Naomy Spahr Today's Date: 02/29/2020 Reason for consult: Follow-up assessment;NICU baby  Baby A was circ'ed today. Mom continues to pump for infant using a [employer] pump at home. Baby A has not bf yet but she is interested in practicing with him at home. Per mom, baby A will d/c today. LC reviewed feeding plan. Mom is aware of LC resources and will f/u prn. She will f/u with [facility] regarding due date to return pump and renewal of pump loan prn. LC offered mother the opportunity to ask questions. All concerns were addressed.   Consult Status Consult Status: Follow-up Date: 03/01/20 Follow-up type: In-patient    [name] 02/29/2020, 3:23 PM

## 2020-03-20 ENCOUNTER — Other Ambulatory Visit: Payer: Medicaid Other

## 2020-03-20 ENCOUNTER — Encounter: Payer: Self-pay | Admitting: Obstetrics and Gynecology

## 2020-03-20 ENCOUNTER — Ambulatory Visit (INDEPENDENT_AMBULATORY_CARE_PROVIDER_SITE_OTHER): Payer: Medicaid Other | Admitting: Obstetrics and Gynecology

## 2020-03-20 ENCOUNTER — Other Ambulatory Visit: Payer: Self-pay

## 2020-03-20 ENCOUNTER — Ambulatory Visit: Payer: Medicaid Other | Admitting: Obstetrics and Gynecology

## 2020-03-20 VITALS — BP 131/82 | HR 62 | Wt 225.0 lb

## 2020-03-20 DIAGNOSIS — D62 Acute posthemorrhagic anemia: Secondary | ICD-10-CM

## 2020-03-20 DIAGNOSIS — Z9851 Tubal ligation status: Secondary | ICD-10-CM

## 2020-03-20 DIAGNOSIS — O24419 Gestational diabetes mellitus in pregnancy, unspecified control: Secondary | ICD-10-CM

## 2020-03-20 NOTE — Addendum Note (Signed)
Addended by: Marya Landry D on: 03/20/2020 10:20 AM   Modules accepted: Orders

## 2020-03-20 NOTE — Patient Instructions (Signed)
Health Maintenance, Female Adopting a healthy lifestyle and getting preventive care are important in promoting health and wellness. Ask your health care provider about:  The right schedule for you to have regular tests and exams.  Things you can do on your own to prevent diseases and keep yourself healthy. What should I know about diet, weight, and exercise? Eat a healthy diet   Eat a diet that includes plenty of vegetables, fruits, low-fat dairy products, and lean protein.  Do not eat a lot of foods that are high in solid fats, added sugars, or sodium. Maintain a healthy weight Body mass index (BMI) is used to identify weight problems. It estimates body fat based on height and weight. Your health care provider can help determine your BMI and help you achieve or maintain a healthy weight. Get regular exercise Get regular exercise. This is one of the most important things you can do for your health. Most adults should:  Exercise for at least 150 minutes each week. The exercise should increase your heart rate and make you sweat (moderate-intensity exercise).  Do strengthening exercises at least twice a week. This is in addition to the moderate-intensity exercise.  Spend less time sitting. Even light physical activity can be beneficial. Watch cholesterol and blood lipids Have your blood tested for lipids and cholesterol at 35 years of age, then have this test every 5 years. Have your cholesterol levels checked more often if:  Your lipid or cholesterol levels are high.  You are older than 35 years of age.  You are at high risk for heart disease. What should I know about cancer screening? Depending on your health history and family history, you may need to have cancer screening at various ages. This may include screening for:  Breast cancer.  Cervical cancer.  Colorectal cancer.  Skin cancer.  Lung cancer. What should I know about heart disease, diabetes, and high blood  pressure? Blood pressure and heart disease  High blood pressure causes heart disease and increases the risk of stroke. This is more likely to develop in people who have high blood pressure readings, are of African descent, or are overweight.  Have your blood pressure checked: ? Every 3-5 years if you are 18-39 years of age. ? Every year if you are 40 years old or older. Diabetes Have regular diabetes screenings. This checks your fasting blood sugar level. Have the screening done:  Once every three years after age 40 if you are at a normal weight and have a low risk for diabetes.  More often and at a younger age if you are overweight or have a high risk for diabetes. What should I know about preventing infection? Hepatitis B If you have a higher risk for hepatitis B, you should be screened for this virus. Talk with your health care provider to find out if you are at risk for hepatitis B infection. Hepatitis C Testing is recommended for:  Everyone born from 1945 through 1965.  Anyone with known risk factors for hepatitis C. Sexually transmitted infections (STIs)  Get screened for STIs, including gonorrhea and chlamydia, if: ? You are sexually active and are younger than 35 years of age. ? You are older than 35 years of age and your health care provider tells you that you are at risk for this type of infection. ? Your sexual activity has changed since you were last screened, and you are at increased risk for chlamydia or gonorrhea. Ask your health care provider if   you are at risk.  Ask your health care provider about whether you are at high risk for HIV. Your health care provider may recommend a prescription medicine to help prevent HIV infection. If you choose to take medicine to prevent HIV, you should first get tested for HIV. You should then be tested every 3 months for as long as you are taking the medicine. Pregnancy  If you are about to stop having your period (premenopausal) and  you may become pregnant, seek counseling before you get pregnant.  Take 400 to 800 micrograms (mcg) of folic acid every day if you become pregnant.  Ask for birth control (contraception) if you want to prevent pregnancy. Osteoporosis and menopause Osteoporosis is a disease in which the bones lose minerals and strength with aging. This can result in bone fractures. If you are 65 years old or older, or if you are at risk for osteoporosis and fractures, ask your health care provider if you should:  Be screened for bone loss.  Take a calcium or vitamin D supplement to lower your risk of fractures.  Be given hormone replacement therapy (HRT) to treat symptoms of menopause. Follow these instructions at home: Lifestyle  Do not use any products that contain nicotine or tobacco, such as cigarettes, e-cigarettes, and chewing tobacco. If you need help quitting, ask your health care provider.  Do not use street drugs.  Do not share needles.  Ask your health care provider for help if you need support or information about quitting drugs. Alcohol use  Do not drink alcohol if: ? Your health care provider tells you not to drink. ? You are pregnant, may be pregnant, or are planning to become pregnant.  If you drink alcohol: ? Limit how much you use to 0-1 drink a day. ? Limit intake if you are breastfeeding.  Be aware of how much alcohol is in your drink. In the [place] one drink equals one 12 oz bottle of beer (355 mL), one 5 oz glass of wine (148 mL), or one 1 oz glass of hard liquor (44 mL). General instructions  Schedule regular health, dental, and eye exams.  Stay current with your vaccines.  Tell your health care provider if: ? You often feel depressed. ? You have ever been abused or do not feel safe at home. Summary  Adopting a healthy lifestyle and getting preventive care are important in promoting health and wellness.  Follow your health care provider's instructions about healthy  diet, exercising, and getting tested or screened for diseases.  Follow your health care provider's instructions on monitoring your cholesterol and blood pressure. This information is not intended to replace advice given to you by your health care provider. Make sure you discuss any questions you have with your health care provider. Document Revised: 04/06/2018 Document Reviewed: 04/06/2018 Elsevier Patient Education  2020 Elsevier Inc.  

## 2020-03-20 NOTE — Progress Notes (Signed)
    Post Partum Visit Note  Alexis Ali is a 34 y.o. [id number] female who presents for a postpartum visit. She is 5 weeks postpartum following a primary cesarean section.  I have fully reviewed the prenatal and intrapartum course. The delivery was at 33 gestational weeks.  Anesthesia: spinal. Postpartum course has been uncomplicated. Babies are is doing well. Baby is feeding by both breast and bottle - Similac Neosure. Bleeding no bleeding. Bowel function is normal. Bladder function is normal. Patient is not sexually active. Contraception method is tubal ligation. Postpartum depression screening: negative.   The pregnancy intention screening data noted above was reviewed. Potential methods of contraception were discussed. The patient elected to proceed with Female Sterilization.        Review of Systems Pertinent items are noted in HPI.    Objective:  There were no vitals taken for this visit.   General:  alert   Breasts:  not examined  Lungs: clear to auscultation bilaterally  Heart:  regular rate and rhythm, S1, S2 normal, no murmur, click, rub or gallop  Abdomen: soft, non-tender; bowel sounds normal; no masses,  no organomegaly   Vulva:  not evaluated  Vagina: not evaluated  Cervix:  not evaluated  Corpus: not examined  Adnexa:  not evaluated  Rectal Exam: Not performed.        Assessment:    nl postpartum exam. Pap smear not done at today's visit.   Plan:   Essential components of care per ACOG recommendations:  1.  Mood and well being: Patient with negative depression screening today. Reviewed local resources for support.  - Patient does not use tobacco - hx of drug use? No    2. Infant care and feeding:  -Patient currently breastmilk feeding? Yes If breastmilk feeding discussed return to work and pumping. If needed, patient was provided letter for work to allow for every 2-3 hr pumping breaks, and to be granted a private location to express breastmilk and  refrigerated area to store breastmilk. Reviewed importance of draining breast regularly to support lactation. -Social determinants of health (SDOH) reviewed in EPIC. No concerns  3. Sexuality, contraception and birth spacing - Patient does not want a pregnancy in the next year.  Desired family size is completed  - Reviewed forms of contraception in tiered fashion. Patient desired bilateral tubal ligation today.   - Discussed birth spacing of 18 months  4. Sleep and fatigue -Encouraged family/partner/community support of 4 hrs of uninterrupted sleep to help with mood and fatigue  5. Physical Recovery  - Discussed patients delivery and complications  - Patient has urinary incontinence? No  - Patient is safe to resume physical and sexual activity   7. Chronic Disease GTT today - PCP follow up  Hamilton Capri, RN Center for [employer], [facility]

## 2020-03-20 NOTE — Progress Notes (Addendum)
    Post Partum Visit Note  Alexis Ali is a 34 y.o. [id number] female who presents for a postpartum visit. She is 5 weeks postpartum following a primary cesarean section.  I have fully reviewed the prenatal and intrapartum course. The delivery was at 33 gestational weeks.  Anesthesia: spinal. Postpartum course has been uncomplicated. Babies are doing well. Baby is feeding by both breast and bottle - Similac Neosure. Bleeding no bleeding. Bowel function is normal. Bladder function is normal. Patient is not sexually active. Contraception method is tubal ligation. Postpartum depression screening: positive.   The pregnancy intention screening data noted above was reviewed. Potential methods of contraception were discussed. The patient elected to proceed with Female Sterilization.        Review of Systems Pertinent items are noted in HPI.    Objective:  There were no vitals taken for this visit.   General:  alert   Breasts:  not examined  Lungs: clear to auscultation bilaterally  Heart:  regular rate and rhythm, S1, S2 normal, no murmur, click, rub or gallop  Abdomen: soft, non-tender; bowel sounds normal; no masses,  no organomegaly, incision well healed   Vulva:  not evaluated  Vagina: not evaluated  Cervix:  not evaluated  Corpus: not examined  Adnexa:  not evaluated  Rectal Exam: Not performed.        Assessment:    Nl postpartum exam. Pap smear not done at today's visit.   H/O GDM Plan:   Essential components of care per ACOG recommendations:  1.  Mood and well being: Patient with negative depression screening today. Reviewed local resources for support.  - Patient does not use tobacco. - hx of drug use? No   2. Infant care and feeding:  -Patient currently breastmilk feeding? Yes If breastmilk feeding discussed return to work and pumping. If needed, patient was provided letter for work to allow for every 2-3 hr pumping breaks, and to be granted a private location to  express breastmilk and refrigerated area to store breastmilk. Reviewed importance of draining breast regularly to support lactation. -Social determinants of health (SDOH) reviewed in EPIC. No concerns  3. Sexuality, contraception and birth spacing - Patient does not want a pregnancy in the next year.  Desired family size is completed children. S/P BTL  4. Sleep and fatigue -Encouraged family/partner/community support of 4 hrs of uninterrupted sleep to help with mood and fatigue  5. Physical Recovery  - Discussed patients delivery - Patient had a c section Patient expressed understanding - Patient has urinary incontinence? No - Patient is safe to resume physical and sexual activity  6.  Health Maintenance - Last pap smear done 6/20 and was normal with negative HPV.   7. Chronic Disease  2 hr GTT today d/t H/O GDM  Pt left without completing Glucola  Nettie Elm, MD Center for [facility]

## 2020-04-03 ENCOUNTER — Encounter: Payer: Self-pay | Admitting: General Practice

## 2020-10-28 ENCOUNTER — Encounter (HOSPITAL_COMMUNITY): Payer: Self-pay | Admitting: Emergency Medicine

## 2020-10-28 ENCOUNTER — Emergency Department (HOSPITAL_COMMUNITY)
Admission: EM | Admit: 2020-10-28 | Discharge: 2020-10-28 | Disposition: A | Discharge status: Home / Self Care | Payer: Medicaid Other | Attending: Emergency Medicine | Admitting: Emergency Medicine

## 2020-10-28 ENCOUNTER — Emergency Department (HOSPITAL_COMMUNITY): Payer: Medicaid Other

## 2020-10-28 ENCOUNTER — Other Ambulatory Visit: Payer: Self-pay

## 2020-10-28 DIAGNOSIS — M47812 Spondylosis without myelopathy or radiculopathy, cervical region: Secondary | ICD-10-CM | POA: Diagnosis not present

## 2020-10-28 DIAGNOSIS — Z87891 Personal history of nicotine dependence: Secondary | ICD-10-CM | POA: Insufficient documentation

## 2020-10-28 DIAGNOSIS — R070 Pain in throat: Secondary | ICD-10-CM | POA: Diagnosis not present

## 2020-10-28 DIAGNOSIS — J039 Acute tonsillitis, unspecified: Secondary | ICD-10-CM | POA: Diagnosis not present

## 2020-10-28 DIAGNOSIS — Z20822 Contact with and (suspected) exposure to covid-19: Secondary | ICD-10-CM | POA: Diagnosis not present

## 2020-10-28 DIAGNOSIS — J020 Streptococcal pharyngitis: Secondary | ICD-10-CM | POA: Insufficient documentation

## 2020-10-28 DIAGNOSIS — R609 Edema, unspecified: Secondary | ICD-10-CM | POA: Diagnosis not present

## 2020-10-28 DIAGNOSIS — N9489 Other specified conditions associated with female genital organs and menstrual cycle: Secondary | ICD-10-CM | POA: Diagnosis not present

## 2020-10-28 DIAGNOSIS — R1310 Dysphagia, unspecified: Secondary | ICD-10-CM | POA: Diagnosis not present

## 2020-10-28 DIAGNOSIS — R590 Localized enlarged lymph nodes: Secondary | ICD-10-CM | POA: Diagnosis not present

## 2020-10-28 LAB — I-STAT BETA HCG BLOOD, ED (MC, WL, AP ONLY): I-stat hCG, quantitative: <5 m[IU]/mL (ref <5)

## 2020-10-28 LAB — I-STAT CHEM 8, ED
BUN: 8 mg/dL (ref 6–20)
Calcium, Ion: 1.15 mmol/L (ref 1.15–1.40)
Chloride: 103 mmol/L (ref 98–111)
Creatinine, Ser: 0.8 mg/dL (ref 0.44–1.00)
Glucose, Bld: 105 mg/dL — ABNORMAL HIGH (ref 70–99)
HCT: 41 % (ref 36.0–46.0)
Hemoglobin: 13.9 g/dL (ref 12.0–15.0)
Potassium: 3.3 mmol/L — ABNORMAL LOW (ref 3.5–5.1)
Sodium: 142 mmol/L (ref 135–145)
TCO2: 27 mmol/L (ref 22–32)

## 2020-10-28 LAB — GROUP A STREP BY PCR: Group A Strep by PCR: DETECTED — AB

## 2020-10-28 MED ORDER — CLINDAMYCIN HCL 300 MG PO CAPS
300.0000 mg | ORAL_CAPSULE | Freq: Once | ORAL | Status: AC
  Administered 2020-10-28: 300 mg via ORAL
  Filled 2020-10-28: qty 1

## 2020-10-28 MED ORDER — KETOROLAC TROMETHAMINE 30 MG/ML IJ SOLN
30.0000 mg | Freq: Once | INTRAMUSCULAR | Status: AC
  Administered 2020-10-28: 30 mg via INTRAVENOUS
  Filled 2020-10-28: qty 1

## 2020-10-28 MED ORDER — LIDOCAINE VISCOUS HCL 2 % MT SOLN: 15.0000 mL | OROMUCOSAL | 0 refills | Status: DC | PRN

## 2020-10-28 MED ORDER — DEXAMETHASONE SODIUM PHOSPHATE 10 MG/ML IJ SOLN
10.0000 mg | Freq: Once | INTRAMUSCULAR | Status: AC
  Administered 2020-10-28: 10 mg via INTRAVENOUS
  Filled 2020-10-28: qty 1

## 2020-10-28 MED ORDER — ACETAMINOPHEN 325 MG PO TABS
650.0000 mg | ORAL_TABLET | Freq: Once | ORAL | Status: AC
  Administered 2020-10-28: 650 mg via ORAL
  Filled 2020-10-28: qty 2

## 2020-10-28 MED ORDER — CLINDAMYCIN HCL 300 MG PO CAPS: 300.0000 mg | ORAL_CAPSULE | Freq: Three times a day (TID) | ORAL | 0 refills | Status: AC

## 2020-10-28 MED ORDER — IOHEXOL 300 MG/ML  SOLN
75.0000 mL | Freq: Once | INTRAMUSCULAR | Status: AC | PRN
  Administered 2020-10-28: 75 mL via INTRAVENOUS

## 2020-10-28 MED ORDER — SODIUM CHLORIDE (PF) 0.9 % IJ SOLN
INTRAMUSCULAR | Status: AC
  Filled 2020-10-28: qty 50

## 2020-10-28 NOTE — ED Triage Notes (Signed)
Patient presents with a sore throat, muffled voice, and spitting up x5 days, states it has not gotten any better. Left side of neck is tender and swollen.

## 2020-10-28 NOTE — ED Provider Notes (Signed)
Emergency Medicine Provider Triage Evaluation Note  Alexis Ali , a 35 y.o. female  was evaluated in triage.  Pt complains of sore throat that is worse on the left side for the past 5 days.  Reports it has gradually worsened.  States that it is affecting her voice.  Has not tried medication to help with symptoms.  No history of similar symptoms in the past.  No cough, trouble breathing or drooling.  Review of Systems  Positive: Sore throat, trismus Negative: Trouble breathing, cough  Physical Exam  BP 133/86 (BP Location: Right Arm)   Pulse 94   Temp (!) 100.4 F (38 C) (Oral)   Resp 18   Ht 5\' 1"  (1.549 m)   Wt 104.3 kg   LMP 10/25/2020 (Exact Date)   SpO2 100%   BMI 43.46 kg/m  Gen:   Awake, no distress   Resp:  Normal effort  MSK:   Moves extremities without difficulty  Other:  Left tonsil is greater in size than right.  Muffled voice noted as well as trismus.  No drooling or signs of respiratory distress  Medical Decision Making  Medically screening exam initiated at 1:03 PM.  Appropriate orders placed.  Alexis Ali was informed that the remainder of the evaluation will be completed by another provider, this initial triage assessment does not replace that evaluation, and the importance of remaining in the ED until their evaluation is complete.  Imaging and lab work ordered informed RN that patient should be roomed soon.   12/26/2020, PA-C 10/28/20 1305    12/29/20, MD 10/30/20 [id number]

## 2020-10-28 NOTE — Discharge Instructions (Signed)
Your CT scan shows that you have swelling of your tonsils however there is no evidence of an abscess. Your strep test was positive. I have placed you on antibiotics and will give lidocaine to swish and spit as needed for pain control. You can also take Tylenol and Motrin to help with pain control at home. Follow-up with the ENT specialist listed below. Return to the ER if you start to experience worsening pain, trouble breathing, trouble swallowing, neck stiffness or chest pain.

## 2020-10-28 NOTE — ED Provider Notes (Signed)
[facility]-EMERGENCY DEPT Provider Note   CSN: [id number] Arrival date & time: 10/28/20  1239     History Chief Complaint  Patient presents with   Sore Throat    Alexis Ali is a 35 y.o. female with a past medical history of depression presenting to the ED with a chief complaint of sore throat.  Reports pain in her throat for the past 5 days and is worse on the left side.  Pain is worse with opening her mouth and is affecting her voice as well.  No history of similar symptoms in the past.  No cough, drooling or trouble breathing.  Has been taking Tylenol and Motrin at home but did not take anything today.  HPI     Past Medical History:  Diagnosis Date   Depression    Gestational diabetes    History of gestational diabetes mellitus (GDM) 10/03/2018   Diagnosed at 14 weeks, not GDM, but Type II DM  Current Diabetic Medications:  None  [X]  Aspirin 81 mg daily after 12 weeks (? A2/B GDM)  For A2/B GDM or higher classes of DM [X]  Diabetes Education and Testing Supplies [ ]  Nutrition Counsult  [ ]  Fetal ECHO after 20 weeks  [ ]  Eye exam for retina evaluation   Baseline and surveillance labs (pulled in from [facility], refresh links as needed)  No results   Vaginal Pap smear, abnormal     Patient Active Problem List   Diagnosis Date Noted   Postpartum care following cesarean delivery 03/20/2020   GDM (gestational diabetes mellitus) 02/08/2020   Poor compliance 02/08/2020   BMI 30s 02/08/2020   S/P cesarean section 02/08/2020   Cesarean delivery delivered 02/08/2020   History of bilateral tubal ligation 02/08/2020   History of IUFD 09/04/2019   Obesity in pregnancy, antepartum 09/04/2019   Alpha thalassemia silent carrier (aa/a-) 10/31/2018   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    Past Surgical History:  Procedure Laterality Date   CESAREAN SECTION MULTI-GESTATIONAL WITH TUBAL N/A 02/08/2020   Procedure: CESAREAN SECTION MULTI-GESTATIONAL WITH TUBAL;   Surgeon: 11/04/2019, MD;  Location: [facility];  Service: Obstetrics;  Laterality: N/A;   DILATION AND CURETTAGE OF UTERUS     DILATION AND EVACUATION N/A 11/16/2018   Procedure: DILATATION AND EVACUATION;  Surgeon: 01/01/2019, MD;  Location: [facility];  Service: Gynecology;  Laterality: N/A;   OPERATIVE ULTRASOUND N/A 11/16/2018   Procedure: OPERATIVE ULTRASOUND;  Surgeon: 02/10/2020, MD;  Location: [facility];  Service: Gynecology;  Laterality: N/A;     OB History     Gravida  3   Para  2   Term  1   Preterm  1   AB  1   Living  3      SAB  1   IAB      Ectopic      Multiple  1   Live Births  3           Family History  Problem Relation Age of Onset   Diabetes Mother    Hypertension Mother    Kidney disease Mother    Stroke Mother    Arthritis Maternal Grandmother     Social History   Tobacco Use   Smoking status: Former    Pack years: 0.00    Types: Cigars    Quit date: 05/29/2019    Years since quitting: 1.4   Smokeless  tobacco: Never  Vaping Use   Vaping Use: Never used  Substance Use Topics   Alcohol use: Not Currently   Drug use: Never    Home Medications Prior to Admission medications   Medication Sig Start Date End Date Taking? Authorizing Provider  clindamycin (CLEOCIN) 300 MG capsule Take 1 capsule (300 mg total) by mouth 3 (three) times daily for 10 days. 10/28/20 11/07/20 Yes Burnell Matlin, PA-C  lidocaine (XYLOCAINE) 2 % solution Use as directed 15 mLs in the mouth or throat as needed for mouth pain. 10/28/20  Yes Leonides Minder, PA-C  ferrous sulfate (FERROUSUL) 325 (65 FE) MG tablet Take 1 tablet (325 mg total) by mouth 2 (two) times daily with a meal. 12/20/19   Hermina StaggersErvin, Michael L, MD  ibuprofen (ADVIL) 800 MG tablet Take 1 tablet (800 mg total) by mouth every 6 (six) hours. 02/11/20   Lazaro ArmsEure, Luther H, MD  Prenatal Vit-Fe Fumarate-FA (PREPLUS) 27-1 MG TABS Take 1 tablet by mouth daily. 11/16/19    Tereso NewcomerAnyanwu, Ugonna A, MD    Allergies    Patient has no known allergies.  Review of Systems   Review of Systems  Constitutional:  Positive for fever. Negative for appetite change and chills.  HENT:  Positive for sore throat and voice change. Negative for ear pain, rhinorrhea and sneezing.   Eyes:  Negative for photophobia and visual disturbance.  Respiratory:  Negative for cough, chest tightness, shortness of breath and wheezing.   Cardiovascular:  Negative for chest pain and palpitations.  Gastrointestinal:  Negative for abdominal pain, blood in stool, constipation, diarrhea, nausea and vomiting.  Genitourinary:  Negative for dysuria, hematuria and urgency.  Musculoskeletal:  Negative for myalgias.  Skin:  Negative for rash.  Neurological:  Negative for dizziness, weakness and light-headedness.   Physical Exam Updated Vital Signs BP 127/85 (BP Location: Right Arm)   Pulse 85   Temp (!) 100.4 F (38 C) (Oral)   Resp 17   Ht 5\' 1"  (1.549 m)   Wt 104.3 kg   LMP 10/25/2020 (Exact Date)   SpO2 100%   BMI 43.46 kg/m   Physical Exam Vitals and nursing note reviewed.  Constitutional:      General: She is not in acute distress.    Appearance: She is well-developed.  HENT:     Head: Normocephalic and atraumatic.     Nose: Nose normal.     Mouth/Throat:     Pharynx: Oropharyngeal exudate and posterior oropharyngeal erythema present.     Tonsils: 1+ on the right. 2+ on the left.     Comments: Bilaterally enlarged tonsils, left greater than right.  Patient with a slight muffled voice and reports trismus as well.  She is tolerating secretions.  No signs of respiratory distress.  No swelling of tongue or submandibular area noted. Eyes:     General: No scleral icterus.       Left eye: No discharge.     Conjunctiva/sclera: Conjunctivae normal.  Cardiovascular:     Rate and Rhythm: Normal rate and regular rhythm.     Heart sounds: Normal heart sounds. No murmur heard.   No friction  rub. No gallop.  Pulmonary:     Effort: Pulmonary effort is normal. No respiratory distress.     Breath sounds: Normal breath sounds.  Abdominal:     General: Bowel sounds are normal. There is no distension.     Palpations: Abdomen is soft.     Tenderness: There is no abdominal tenderness.  There is no guarding.  Musculoskeletal:        General: Normal range of motion.     Cervical back: Normal range of motion and neck supple.  Skin:    General: Skin is warm and dry.     Findings: No rash.  Neurological:     Mental Status: She is alert.     Motor: No abnormal muscle tone.     Coordination: Coordination normal.    ED Results / Procedures / Treatments   Labs (all labs ordered are listed, but only abnormal results are displayed) Labs Reviewed  GROUP A STREP BY PCR - Abnormal; Notable for the following components:      Result Value   Group A Strep by PCR DETECTED (*)    All other components within normal limits  I-STAT CHEM 8, ED - Abnormal; Notable for the following components:   Potassium 3.3 (*)    Glucose, Bld 105 (*)    All other components within normal limits  I-STAT BETA HCG BLOOD, ED ([facility], [facility], [facility] ONLY)    EKG None  Radiology CT Soft Tissue Neck W Contrast  Result Date: 10/28/2020 CLINICAL DATA:  Throat soreness, difficulty swallowing EXAM: CT NECK WITH CONTRAST TECHNIQUE: Multidetector CT imaging of the neck was performed using the standard protocol following the bolus administration of intravenous contrast. CONTRAST:  17mL OMNIPAQUE IOHEXOL 300 MG/ML  SOLN COMPARISON:  None. FINDINGS: Pharynx and larynx: Prominence of the palatine tonsils greater than adenoids. Heterogeneous enhancement of the palatine tonsils with low attenuation areas but no definite drainable abscess. Mild infiltration of left greater than right parapharyngeal fat. Airway is patent. Normal epiglottis. Larynx is unremarkable. Salivary glands: Parotid and submandibular glands are unremarkable. Thyroid:  Normal. Lymph nodes: Bilateral enlarged level 2 nodes. Largest is on the left and measures 2.8 cm. Additional nonenlarged internal jugular chain nodes are also present bilaterally. Vascular: Major neck vessels are patent. Limited intracranial: No abnormal enhancement. Visualized orbits: Unremarkable. Mastoids and visualized paranasal sinuses: Aerated. Skeleton: Cervical spine degenerative changes. 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 Canal narrowing is greatest at this level. Upper chest: Included lung apices are clear. Other: None. IMPRESSION: Palatine tonsillitis with phlegmonous changes but no definite drainable abscess at this time. Reactive adenopathy. Electronically Signed   By: Guadlupe Spanish M.D.   On: 10/28/2020 14:33    Procedures Procedures   Medications Ordered in ED Medications  sodium chloride (PF) 0.9 % injection (has no administration in time range)  ketorolac (TORADOL) 30 MG/ML injection 30 mg (has no administration in time range)  clindamycin (CLEOCIN) capsule 300 mg (has no administration in time range)  acetaminophen (TYLENOL) tablet 650 mg (650 mg Oral Given 10/28/20 1328)  dexamethasone (DECADRON) injection 10 mg (10 mg Intravenous Given 10/28/20 1330)  iohexol (OMNIPAQUE) 300 MG/ML solution 75 mL (75 mLs Intravenous Contrast Given 10/28/20 1407)    ED Course  I have reviewed the triage vital signs and the nursing notes.  Pertinent labs & imaging results that were available during my care of the patient were reviewed by me and considered in my medical decision making (see chart for details).  Clinical Course as of 10/28/20 1457  Mon Oct 28, 2020  1345 I-stat hCG, quantitative: <5.0 [HK]  1345 Creatinine: 0.80 [HK]  1440 Group A Strep by PCR(!): DETECTED [HK]    Clinical Course User Index [HK] Dietrich Pates, PA-C   MDM Rules/Calculators/A&P  35 year old female presenting to the ED for sore throat.  There is tonsillar  enlargement bilaterally but it is greater on the left.  She has a muffled voice and is febrile here.  She is supporting her airway and tolerating her secretions at this time but she does have trismus.  She is febrile here on arrival.  Will give Tylenol and Decadron while we await lab work and CT of the neck to rule out a peritonsillar abscess.  Strep test is positive.  CT shows findings consistent with phlegmon changes of the tonsils with enlargement without evidence of abscess.  She was given Decadron, Toradol and Tylenol here to help with pain and inflammation as well as swelling. Will treat with clindamycin and have her follow-up with ENT.  She remains nontoxic-appearing and improved with medications. Remains with patent airway and no signs of respiratory distress.   Patient is hemodynamically stable, in NAD, and able to ambulate in the ED. Evaluation does not show pathology that would require ongoing emergent intervention or inpatient treatment. I explained the diagnosis to the patient. Pain has been managed and has no complaints prior to discharge. Patient is comfortable with above plan and is stable for discharge at this time. All questions were answered prior to disposition. Strict return precautions for returning to the ED were discussed. Encouraged follow up with PCP.   An After Visit Summary was printed and given to the patient.   Portions of this note were generated with Scientist, clinical (histocompatibility and immunogenetics). Dictation errors may occur despite best attempts at proofreading.  Final Clinical Impression(s) / ED Diagnoses Final diagnoses:  Strep pharyngitis    Rx / DC Orders ED Discharge Orders          Ordered    clindamycin (CLEOCIN) 300 MG capsule  3 times daily        10/28/20 1457    lidocaine (XYLOCAINE) 2 % solution  As needed        10/28/20 1457             Dietrich Pates, PA-C 10/28/20 1457    Gwyneth Sprout, MD 10/30/20 2222

## 2020-10-28 NOTE — ED Triage Notes (Signed)
Per EMS, patient from home, c/o sore throat x2 days with swelling to L throat. Denies cough and fever. Denies difficulty breathing.

## 2021-03-04 IMAGING — US US MFM OB LIMITED
1 series · 14 of 23 positions shown · non-contrast
Comparison: none

[Series 1: us mfm ob limited · 23 acquisitions, 14 frames shown]
[im 1/23]
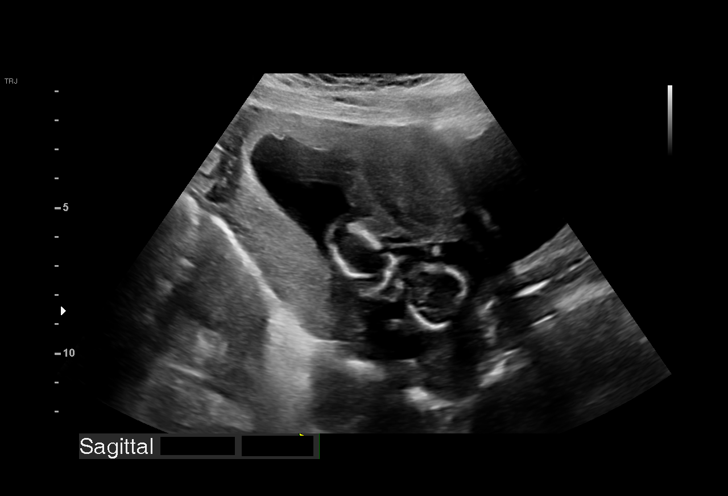
[im 3/23]
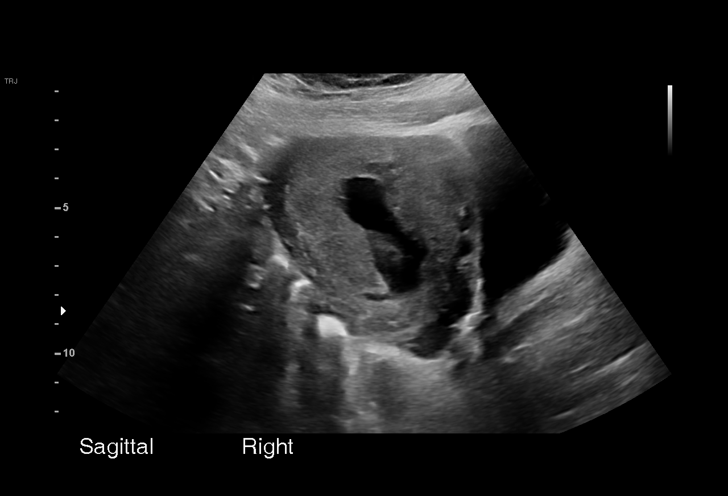
[im 5/23]
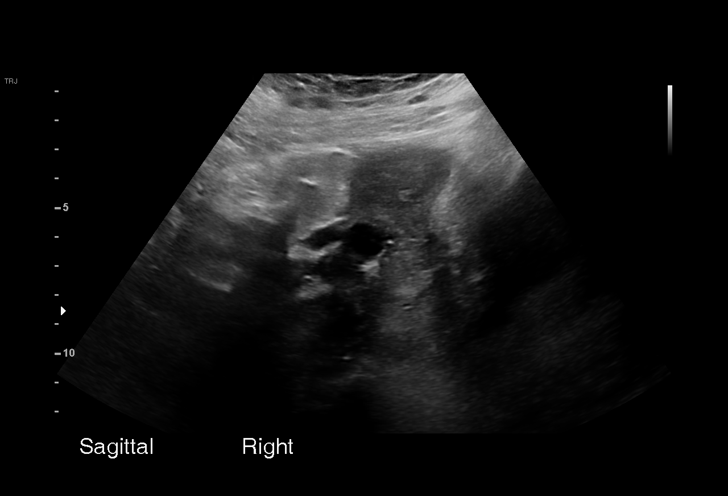
[im 6/23]
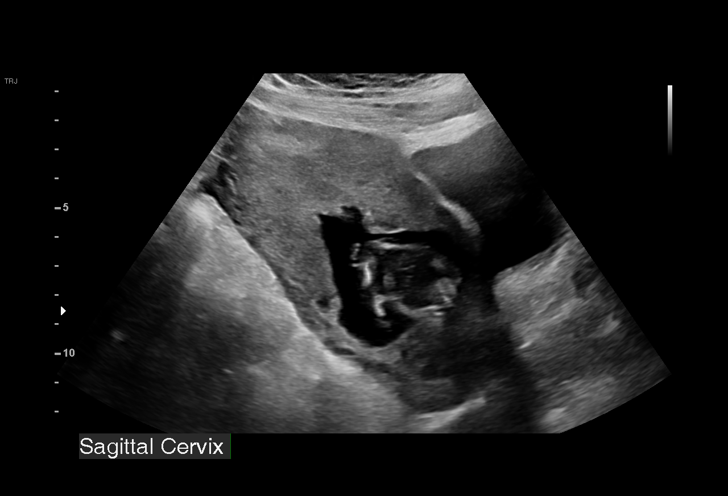
[im 8/23]
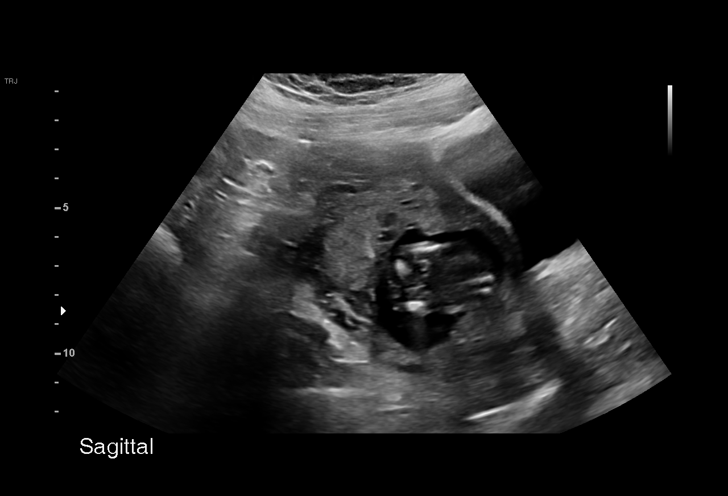
[im 10/23]
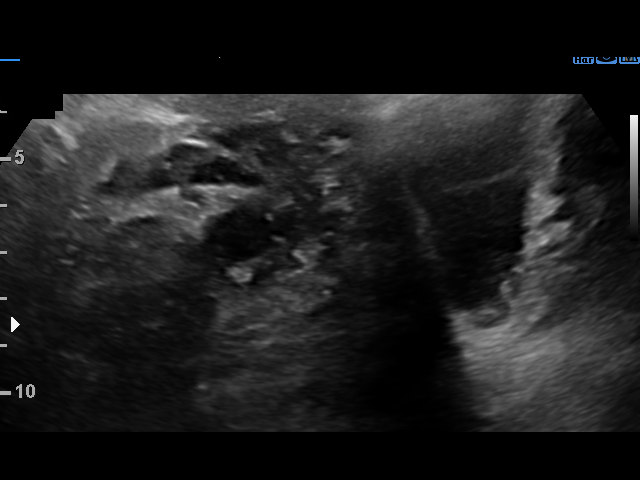
[im 11/23]
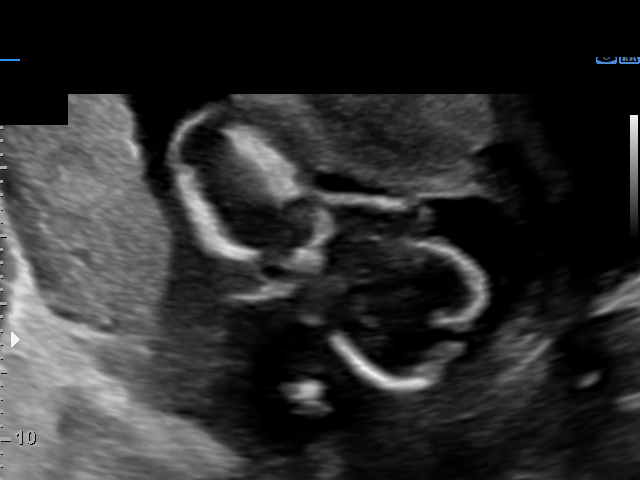
[im 13/23]
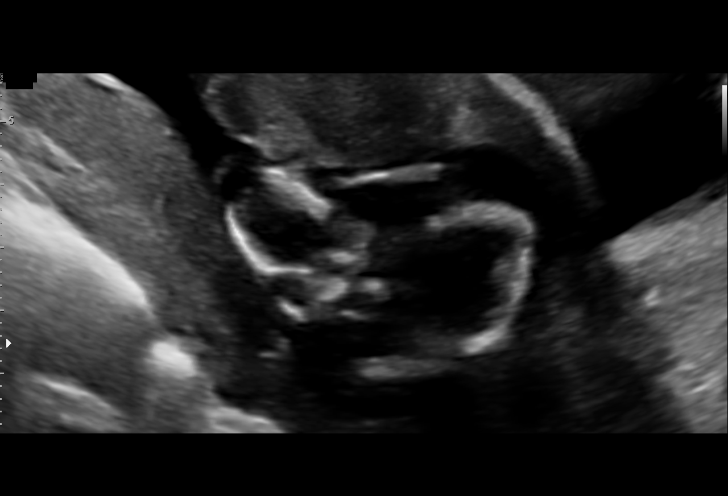
[im 14/23]
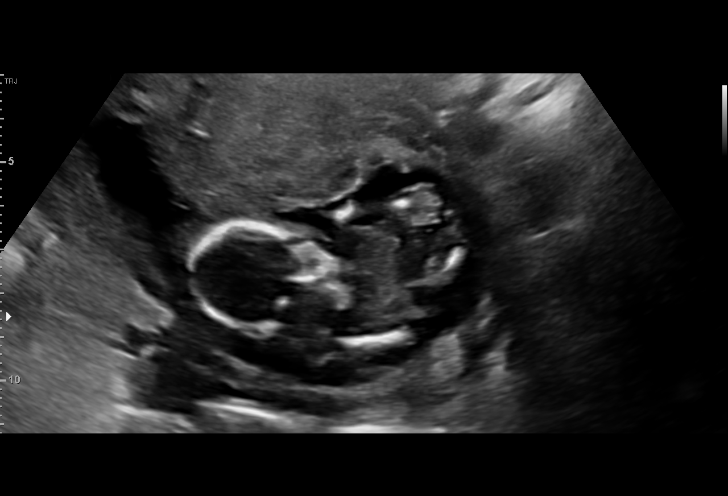
[im 16/23]
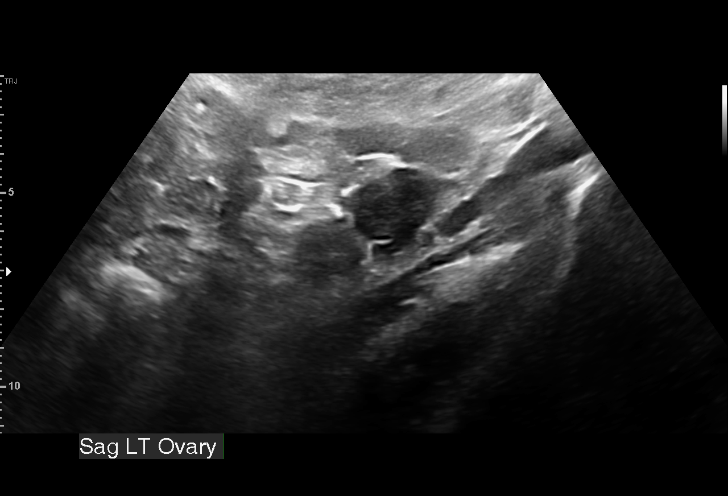
[im 18/23]
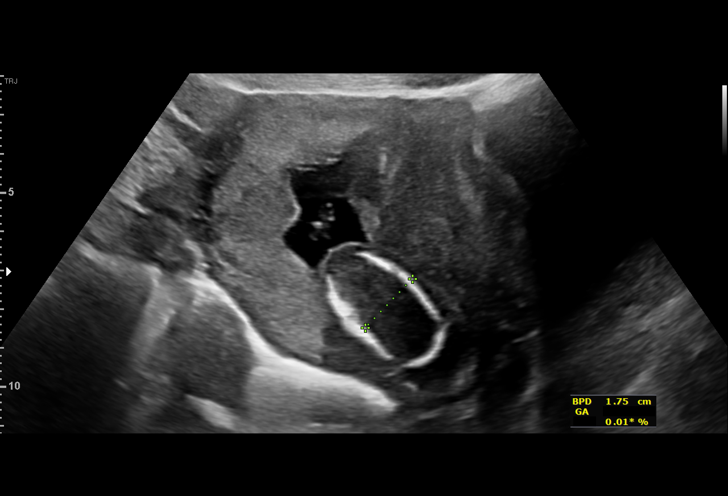
[im 19/23]
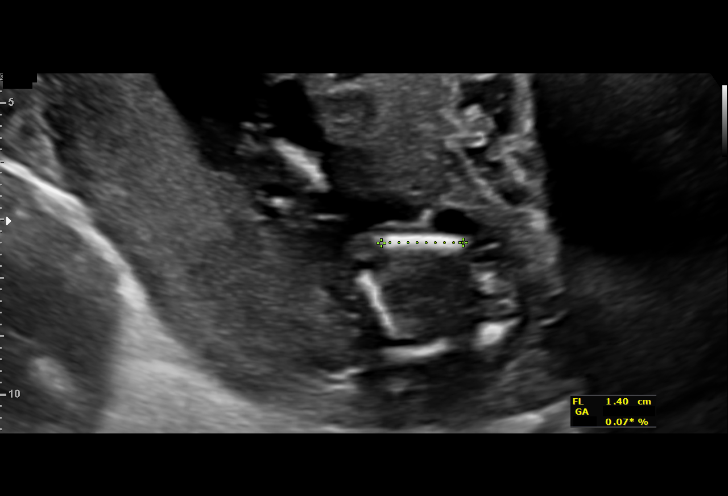
[im 21/23]
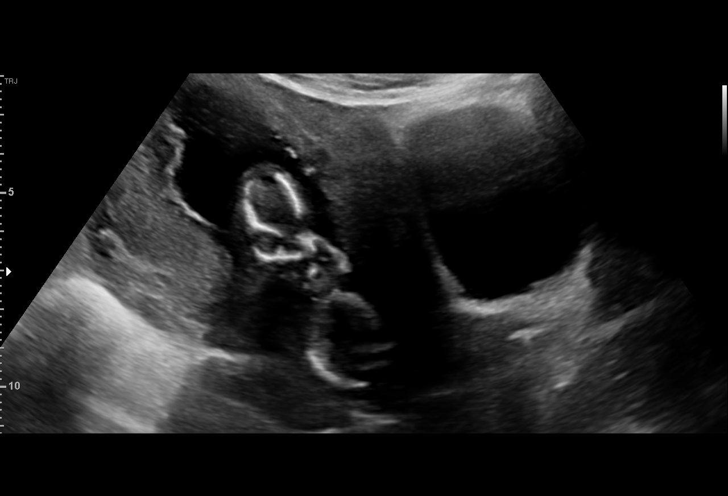
[im 23/23]
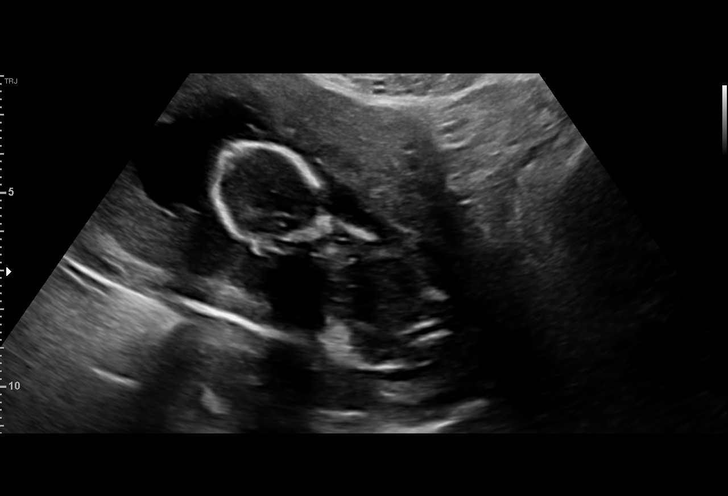

[14 of 23 positions shown; findings below may reference images not displayed]

1  US MFM OB LIMITED                    [id number]     HALLE GETER
 ----------------------------------------------------------------------

 ----------------------------------------------------------------------
Indications

  Pregnancy with inconclusive fetal viability
  Unable to hear fetal heart tones as reason     O76
  for ultrasound
  Gestational diabetes in pregnancy, diet
  controlled
  Maternal thalassemia complicating
  pregnancy in second trimester (Alpha silent
  carrier-aa/a-)
  Genetic carrier (monoallelic mutation of
  MALIKOV1 gene)
  17 weeks gestation of pregnancy
 ----------------------------------------------------------------------
Fetal Evaluation

 Num Of Fetuses:         1
 Cardiac Activity:       Absent
 Fetal Lie:              Transverse
 Placenta:               Posterior

 Amniotic Fluid
 AFI FV:      Within normal limits
Biometry

 FL:         16  mm     G. Age:  14w 5d        < 1  %
OB History

 Gravidity:    2         Term:   1
 Living:       1
Gestational Age

 LMP:           17w 0d        Date:  07/18/18                 EDD:   04/24/19
 U/S Today:     14w 5d                                        EDD:   05/10/19
 Best:          17w 0d     Det. By:  LMP  (07/18/18)          EDD:   04/24/19
Impression

 Ms. Ceejay, Paulus N2 P1 at 17-weeks' gestation, is here for
 ultrasound evaluation. Fetal heart tone was not detected on
 your office examination.

 Patient has type 2 diabetes and has just started checking her
 blood glucose.

 A limited ultrasound study was performed. Amniotic fluid is
 normal. Unfortunately, no fetal heart activity is seen. Femur
 length measurement is consistent with 14w 5d.

 I explained the findings and the possible causes including
 diabetes or chromosomal anomalies. I discussed the option
 of D&E (she will not be able to see an intact fetus) or
 misoprostol induction. Patient opted for D&E. I also
 discussed amniocentesis for fetal karyotype and microarray.
 Alternatively, the fetal tissue can be sent for microarray
 (HER). Patient opted not to have amniocentesis.

 Informed Dr. Klever, who will be making OR arrangements.
Recommendations

 -D&E.
 -Fetal tissue to be sent for microarray analysis (HER).
                 Attiila, Lichtmann

## 2022-03-19 IMAGING — US US MFM OB FOLLOW-UP
1 series · 15 of 28 positions shown · non-contrast
Comparison: none

[Series 1: us mfm ob follow-up · 15 of 142 slices shown]
[im 1/142]
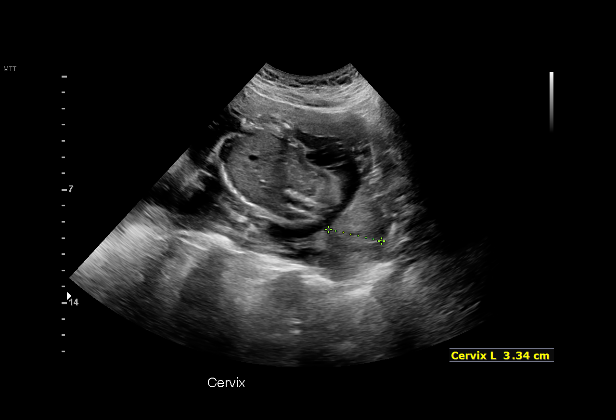
[im 11/142]
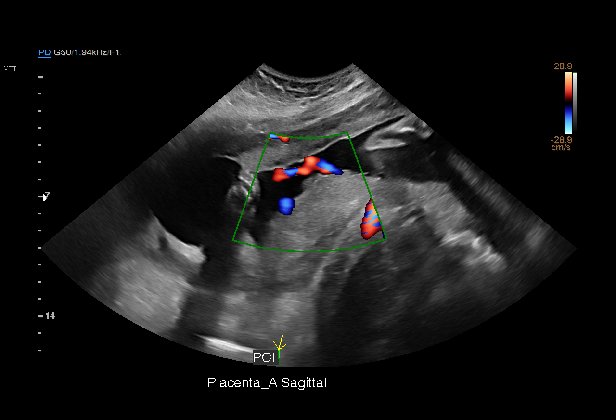
[im 21/142]
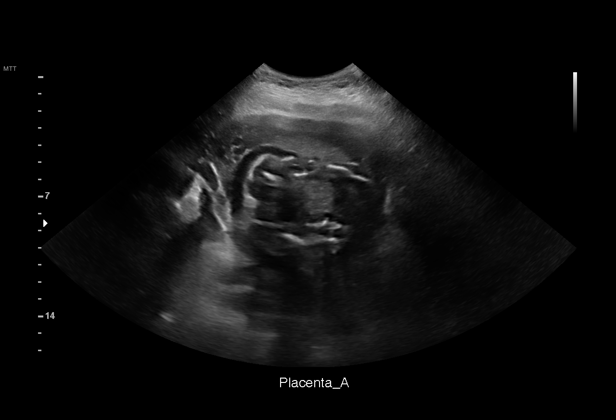
[im 32/142]
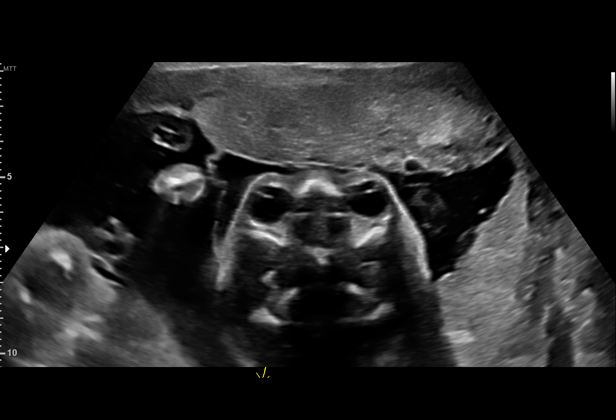
[im 42/142]
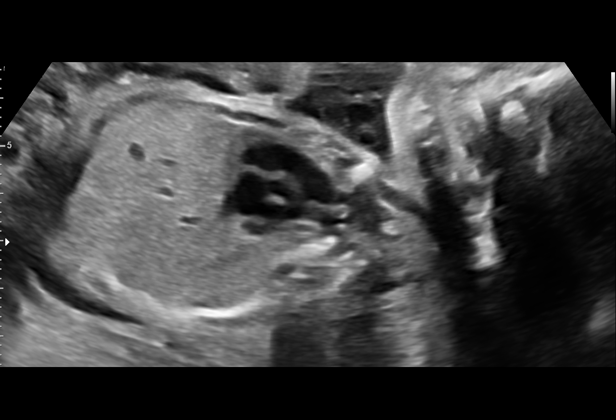
[im 53/142]
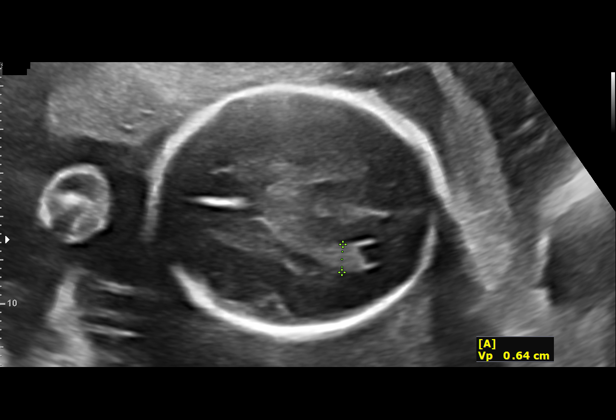
[im 63/142]
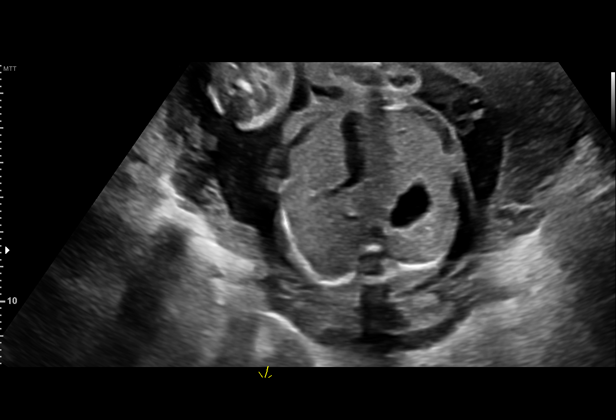
[im 74/142]
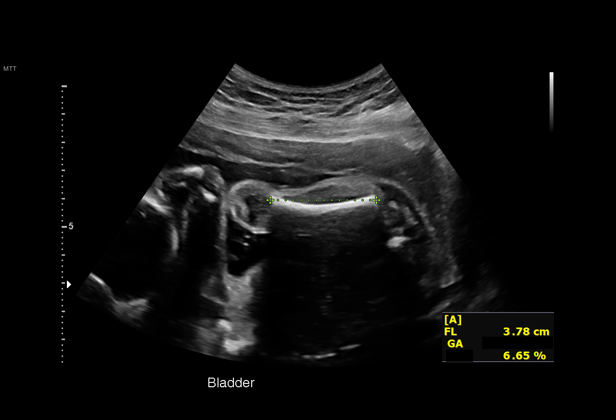
[im 79/142]
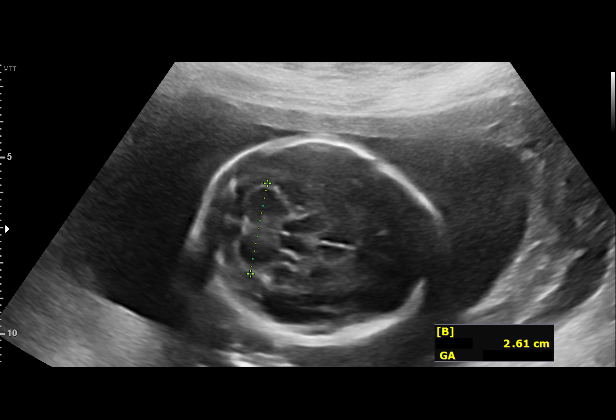
[im 89/142]
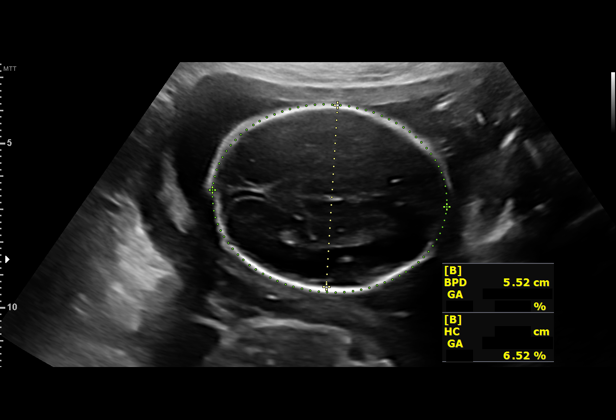
[im 100/142]
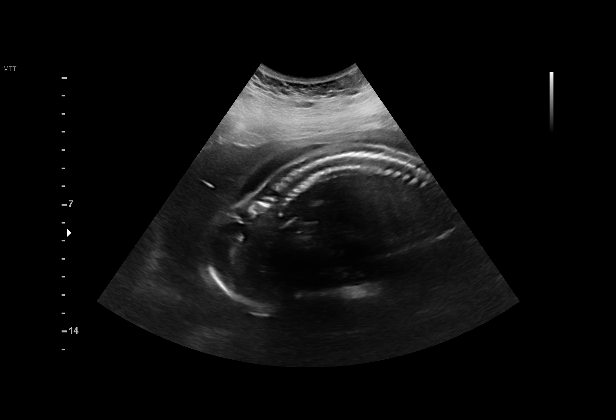
[im 110/142]
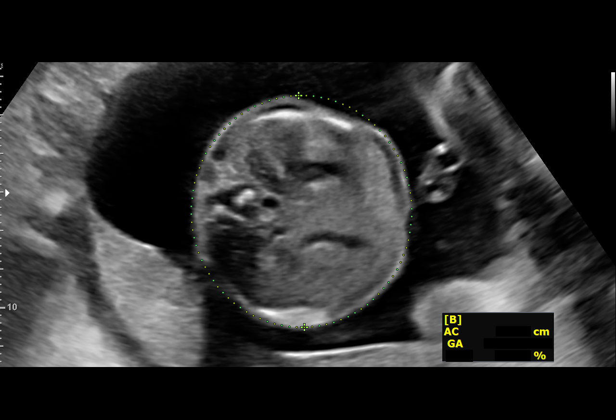
[im 121/142]
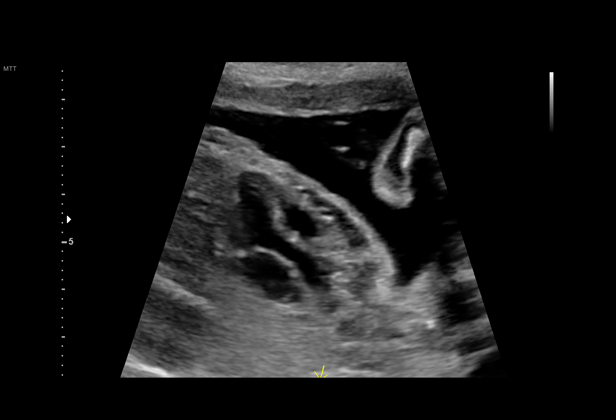
[im 131/142]
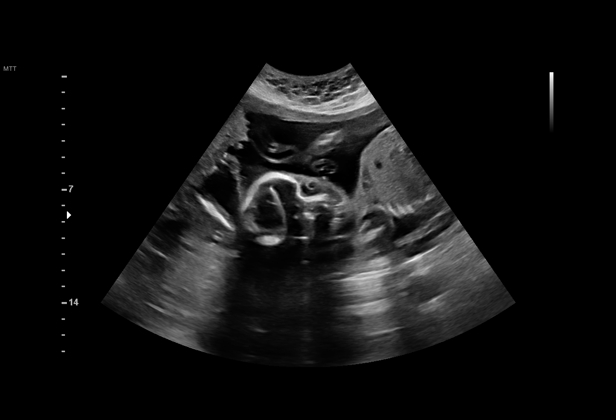
[im 142/142]
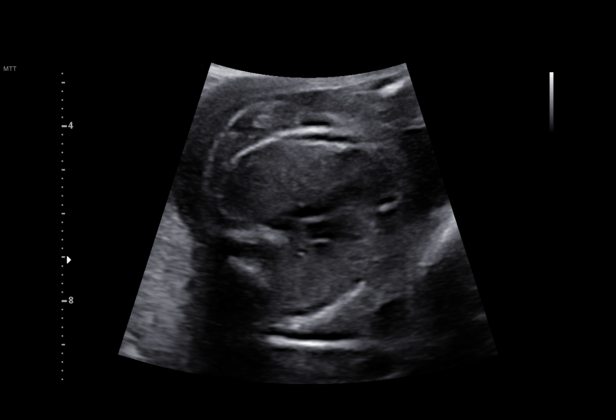

[15 of 28 positions shown; findings below may reference images not displayed]

GEST

Indications

 Twin pregnancy, di/di, second trimester
 23 weeks gestation of pregnancy
 Obesity complicating pregnancy, second
 trimester
 Encounter for antenatal screening for
 malformations (NIPS - LR Male & Female)
 Genetic carrier ([place], Davor Magdalena1
 Monoallelic Mutation)
 Unspecified diabetes in pregnancy, second
 trimester
 Poor obstetric history: Previous midtrimester
 loss (17 week IUFD)
Fetal Evaluation (Fetus A)

 Num Of Fetuses:         2
 Fetal Heart Rate(bpm):  145
 Cardiac Activity:       Observed
 Fetal Lie:              Lower Fetus
 Presentation:           Breech
 Placenta:               Left lateral
 P. Cord Insertion:      Previously Visualized
 Membrane Desc:      Dividing Membrane seen
 Amniotic Fluid
 AFI FV:      Within normal limits

                             Largest Pocket(cm)

Biometry (Fetus A)

 BPD:      56.1  mm     G. Age:  23w 1d         33  %    CI:        76.57   %    70 - 86
                                                         FL/HC:      18.7   %    19.2 -
 HC:      203.1  mm     G. Age:  22w 3d          7  %    HC/AC:      1.10        1.05 -
 AC:      185.1  mm     G. Age:  23w 2d         37  %    FL/BPD:     67.7   %    71 - 87
 FL:         38  mm     G. Age:  22w 1d          8  %    FL/AC:      20.5   %    20 - 24
 CER:      25.1  mm     G. Age:  22w 6d         55  %

 LV:        6.4  mm

 Est. FW:     534  gm      1 lb 3 oz     16  %     FW Discordancy         1  %
OB History

 Blood Type:   AB+
 Gravidity:    3         Term:   1        Prem:   0        SAB:   1
 TOP:          0       Ectopic:  0        Living: 1
Gestational Age (Fetus A)

 LMP:           23w 3d        Date:  06/18/19                 EDD:   03/24/20
 U/S Today:     22w 5d                                        EDD:   03/29/20
 Best:          23w 3d     Det. By:  LMP  (06/18/19)          EDD:   03/24/20
Anatomy (Fetus A)

 Cranium:               Previously seen        Aortic Arch:            Not well visualized
 Cavum:                 Previously seen        Ductal Arch:            Appears normal
 Ventricles:            Appears normal         Diaphragm:              Appears normal
 Choroid Plexus:        Previously seen        Stomach:                Appears normal, left
                                                                       sided
 Cerebellum:            Appears normal         Abdomen:                Appears normal
 Posterior Fossa:       Previously seen        Abdominal Wall:         Previously seen
 Nuchal Fold:           Previously seen        Cord Vessels:           Appears normal (3
                                                                       vessel cord)
 Face:                  Appears normal         Kidneys:                Appear normal
                        (orbits and profile)
 Lips:                  Appears normal         Bladder:                Appears normal
 Thoracic:              Appears normal         Spine:                  Limited views
                                                                       previously seen
 Heart:                 Appears normal         Upper Extremities:      Visualized
                        (4CH, axis, and                                previously
                        situs)
 RVOT:                  Appears normal         Lower Extremities:      Visualized
                                                                       previously
 LVOT:                  Appears normal

 Other:  Male gender. Heels previously seen. Technically difficult due to
         maternal habitus and fetal position.
Fetal Evaluation (Fetus B)
 Num Of Fetuses:         2
 Fetal Heart Rate(bpm):  150
 Cardiac Activity:       Observed
 Fetal Lie:              Upper Fetus
 Presentation:           Transverse, head to maternal right
 Placenta:               Fundal
 P. Cord Insertion:      Previously Visualized
 Membrane Desc:      Dividing Membrane seen

 Amniotic Fluid
 AFI FV:      Within normal limits

                             Largest Pocket(cm)

Biometry (Fetus B)

 BPD:      55.7  mm     G. Age:  23w 0d         28  %    CI:        77.15   %    70 - 86
                                                         FL/HC:      19.9   %    19.2 -
 HC:      200.8  mm     G. Age:  22w 2d        4.3  %    HC/AC:      1.11        1.05 -
 AC:      181.1  mm     G. Age:  23w 0d         27  %    FL/BPD:     71.8   %    71 - 87
 FL:         40  mm     G. Age:  22w 6d         22  %    FL/AC:      22.1   %    20 - 24
 CER:      26.1  mm     G. Age:  23w 4d         75  %
 LV:        5.8  mm

 Est. FW:     542  gm      1 lb 3 oz     19  %     FW Discordancy      0 \ 1 %
Gestational Age (Fetus B)

 LMP:           23w 3d        Date:  06/18/19                 EDD:   03/24/20
 U/S Today:     22w 6d                                        EDD:   03/28/20
 Best:          23w 3d     Det. By:  LMP  (06/18/19)          EDD:   03/24/20
Anatomy (Fetus B)

 Cranium:               Appears normal         Aortic Arch:            Appears normal
 Cavum:                 Previously seen        Ductal Arch:            Not well visualized
 Ventricles:            Appears normal         Diaphragm:              Appears normal
 Choroid Plexus:        Previously seen        Stomach:                Appears normal, left
                                                                       sided
 Cerebellum:            Appears normal         Abdomen:                Previously seen
 Posterior Fossa:       Previously seen        Abdominal Wall:         Previously seen
 Nuchal Fold:           Previously seen        Cord Vessels:           Previously seen
 Face:                  Orbits nl previously   Kidneys:                Appear normal
                        profile not well vis
 Lips:                  Previously seen        Bladder:                Appears normal
 Thoracic:              Appears normal         Spine:                  Limited views
                                                                       appear normal
 Heart:                 Appears normal         Upper Extremities:      Visualized
                        (4CH, axis, and                                previously
                        situs)
 RVOT:                  Previously seen        Lower Extremities:      Visualized
                                                                       previously
 LVOT:                  Appears normal

 Other:  Female gender previously seen. Heels appear normal. Technically
         difficult due to maternal habitus and fetal position.
Cervix Uterus Adnexa

 Cervix
 Length:           3.34  cm.
 Normal appearance by transabdominal scan.

 Uterus
 Intrauterine synechiae seen at LLQ

 Right Ovary
 Within normal limits. No adnexal mass visualized.

 Left Ovary
 Within normal limits. No adnexal mass visualized.

 Cul De Sac
 No free fluid seen.

 Adnexa
 No abnormality visualized.
Comments

 This patient was seen for a follow up growth scan due to a
 dichorionic, diamniotic twin pregnancy.  She denies any
 problems since her last exam.
 She was informed that the fetal growth and amniotic fluid
 level appears appropriate for her gestational age for both twin
 A and twin B.
 The views of the fetal anatomy for both fetuses remain limited
 today.  However, there were no obvious anomalies suspected
 in either fetus.
 A follow up exam was scheduled in 4 weeks.

## 2022-04-16 IMAGING — US US MFM OB FOLLOW-UP
1 series · 12 of 28 positions shown · non-contrast
Comparison: none

[Series 1: us mfm ob follow-up · 12 of 94 slices shown]
[im 4/94]
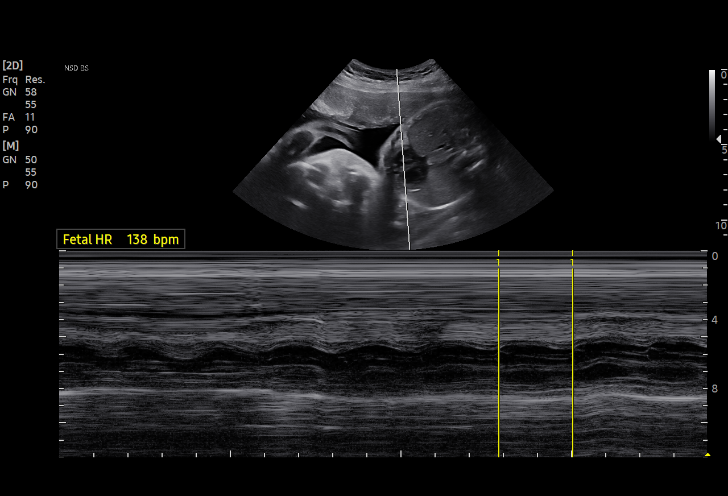
[im 11/94]
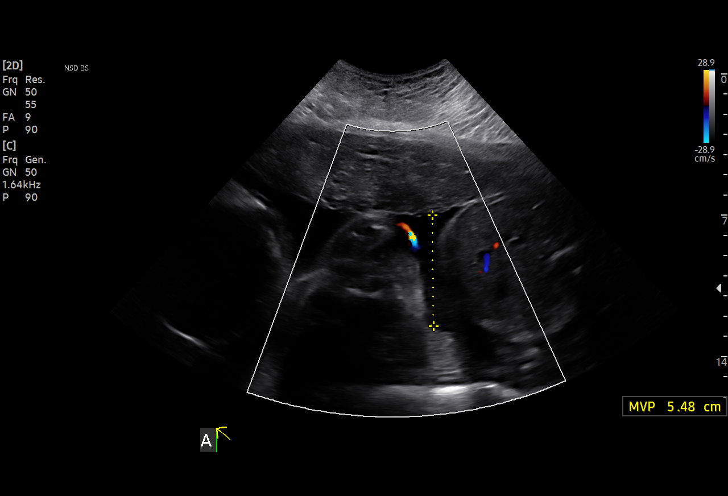
[im 18/94]
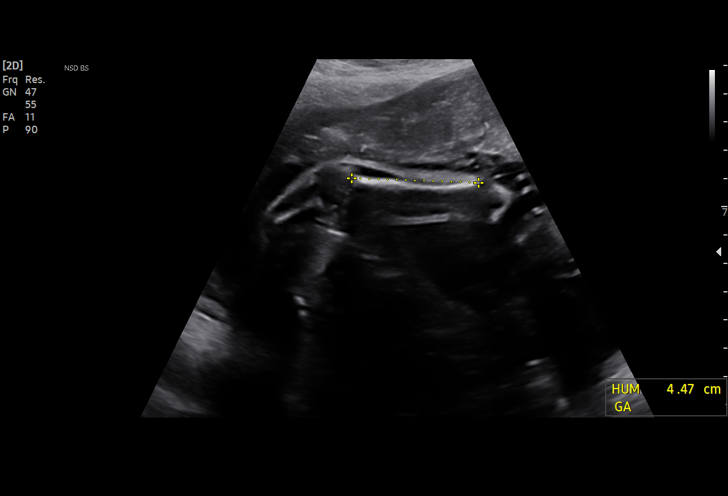
[im 28/94]
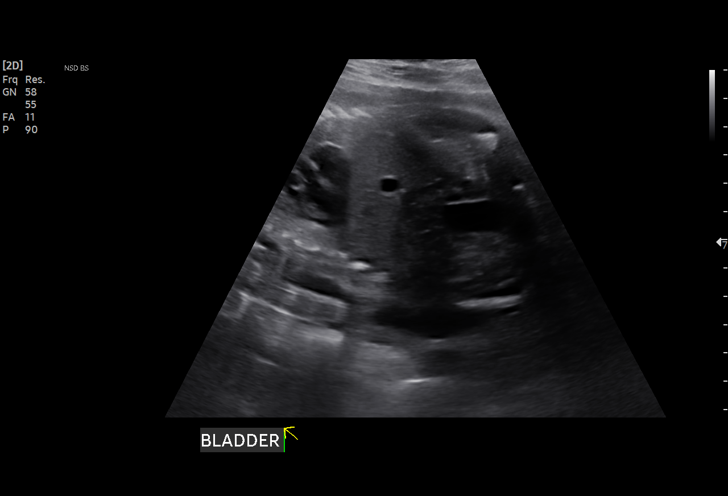
[im 35/94]
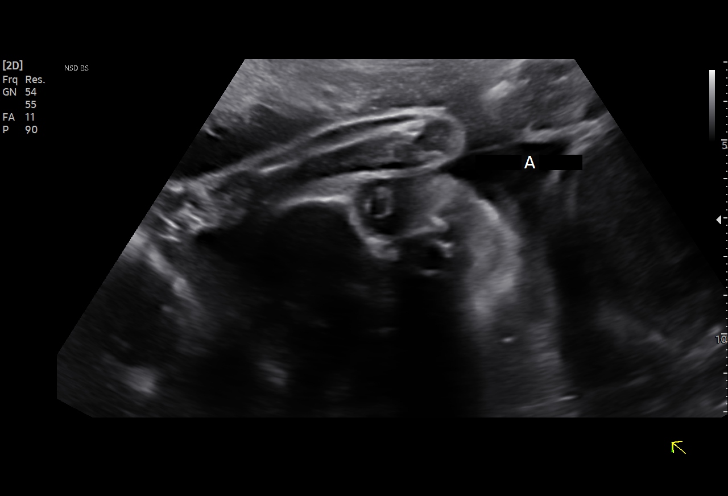
[im 42/94]
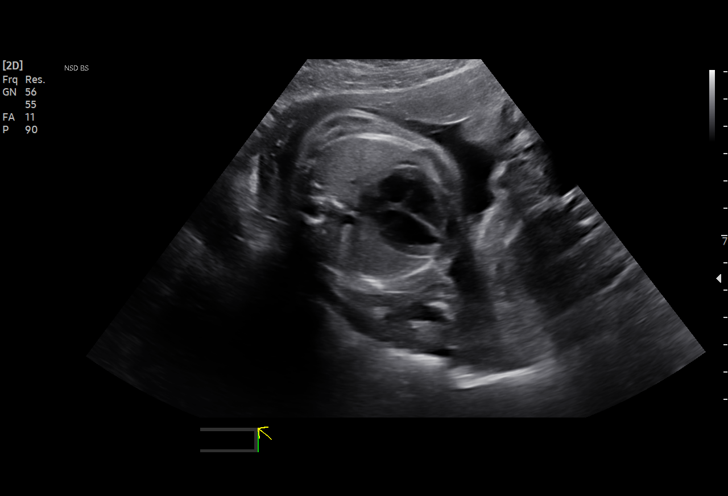
[im 52/94]
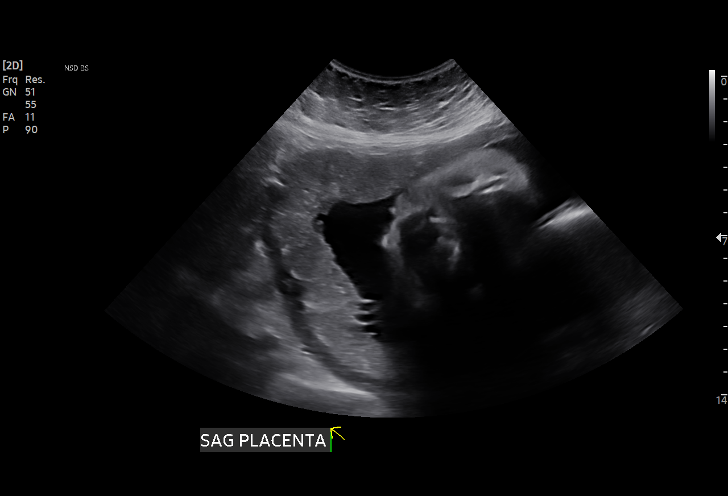
[im 59/94]
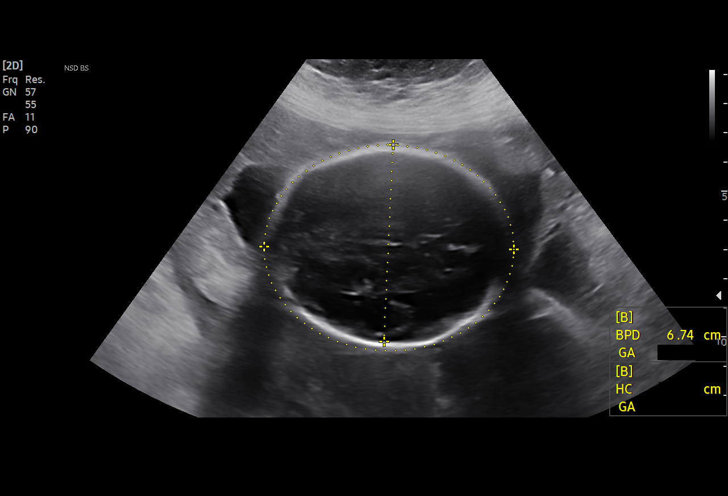
[im 66/94]
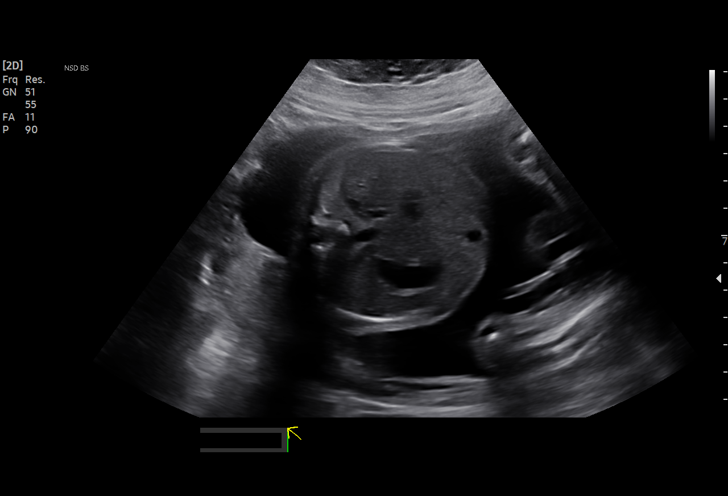
[im 76/94]
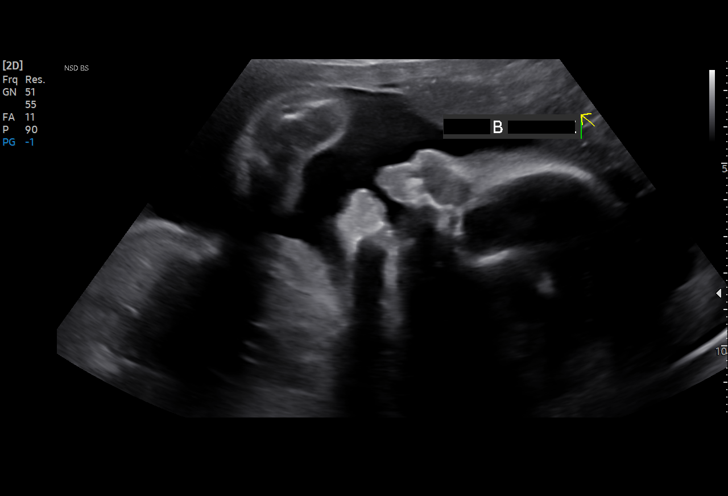
[im 83/94]
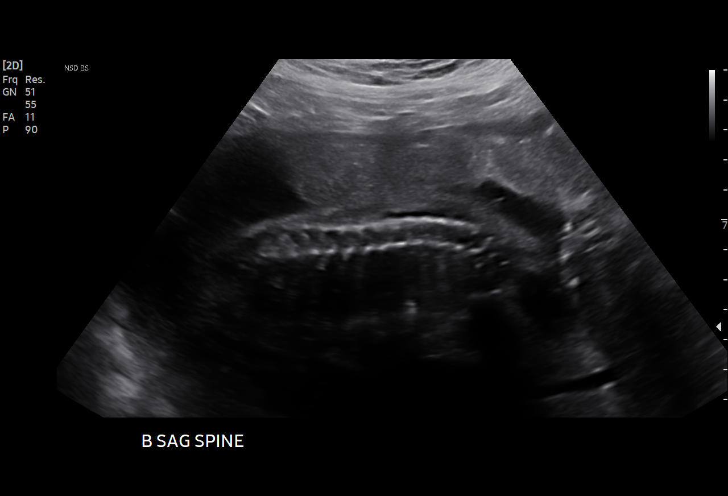
[im 90/94]
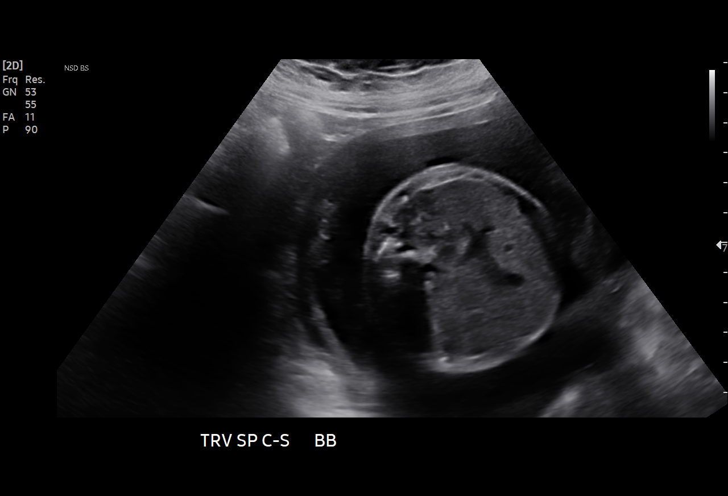

[12 of 28 positions shown; findings below may reference images not displayed]

GEST

Indications

 Twin pregnancy, di/di, second trimester
 27 weeks gestation of pregnancy
 Obesity complicating pregnancy, second
 trimester
 LR NIPS (Lienad and Female)
 Genetic carrier (Blandl, Klinikum1
 Monoallelic Mutation)
 Unspecified diabetes in pregnancy, second
 trimester
 Poor obstetric history: Previous midtrimester
 loss (17 week IUFD)
 Antenatal follow-up for nonvisualized fetal
 anatomy
Fetal Evaluation (Fetus A)

 Num Of Fetuses:         2
 Fetal Heart Rate(bpm):  138
 Cardiac Activity:       Observed
 Presentation:           Frank breech
 Placenta:               Left lateral
 P. Cord Insertion:      Previously Visualized
 Membrane Desc:      Dividing Membrane seen
 Amniotic Fluid
 AFI FV:      Within normal limits

                             Largest Pocket(cm)

Biometry (Fetus A)

 BPD:      66.4  mm     G. Age:  26w 5d         19  %    CI:        71.09   %    70 - 86
                                                         FL/HC:      19.2   %    18.6 -
 HC:      250.9  mm     G. Age:  27w 2d         17  %    HC/AC:      1.11        1.05 -
 AC:      226.1  mm     G. Age:  27w 0d         29  %    FL/BPD:     72.4   %    71 - 87
 FL:       48.1  mm     G. Age:  26w 1d          7  %    FL/AC:      21.3   %    20 - 24
 HUM:      45.3  mm     G. Age:  26w 6d         32  %

 LV:        2.8  mm

 Est. FW:     972  gm      2 lb 2 oz     15  %     FW Discordancy         4  %
OB History

 Blood Type:   AB+
 Gravidity:    3         Term:   1        Prem:   0        SAB:   1
 TOP:          0       Ectopic:  0        Living: 1
Gestational Age (Fetus A)

 LMP:           27w 3d        Date:  06/18/19                 EDD:   03/24/20
 U/S Today:     26w 6d                                        EDD:   03/28/20
 Best:          27w 3d     Det. By:  LMP  (06/18/19)          EDD:   03/24/20
Anatomy (Fetus A)

 Cranium:               Previously seen        Aortic Arch:            Appears normal
 Cavum:                 Previously seen        Ductal Arch:            Previously seen
 Ventricles:            Appears normal         Diaphragm:              Appears normal
 Choroid Plexus:        Previously seen        Stomach:                Appears normal, left
                                                                       sided
 Cerebellum:            Previously seen        Abdomen:                Appears normal
 Posterior Fossa:       Previously seen        Abdominal Wall:         Previously seen
 Nuchal Fold:           Previously seen        Cord Vessels:           Previously seen
 Face:                  Orbits and profile     Kidneys:                Appear normal
                        previously seen
 Lips:                  Previously seen        Bladder:                Appears normal
 Thoracic:              Appears normal         Spine:                  Limited views
                                                                       previously seen
 Heart:                 Appears normal         Upper Extremities:      Previously seen
                        (4CH, axis, and
                        situs)
 RVOT:                  Previously seen        Lower Extremities:      Previously seen
 LVOT:                  Previously seen

 Other:  Male gender. Heels previously seen. Technically difficult due to
         maternal habitus and fetal position.
Fetal Evaluation (Fetus B)
 Num Of Fetuses:         2
 Fetal Heart Rate(bpm):  152
 Cardiac Activity:       Observed
 Presentation:           Transverse, head to maternal left
 Placenta:               Fundal
 P. Cord Insertion:      Previously Visualized

 Amniotic Fluid
 AFI FV:      Within normal limits

                             Largest Pocket(cm)

Biometry (Fetus B)

 BPD:      67.5  mm     G. Age:  27w 1d         30  %    CI:         75.2   %    70 - 86
                                                         FL/HC:      20.3   %    18.6 -
 HC:      246.9  mm     G. Age:  26w 6d          9  %    HC/AC:      1.09        1.05 -
 AC:      225.9  mm     G. Age:  27w 0d         28  %    FL/BPD:     74.4   %    71 - 87
 FL:       50.2  mm     G. Age:  27w 0d         23  %    FL/AC:      22.2   %    20 - 24
 HUM:      46.6  mm     G. Age:  27w 3d         47  %
 LV:        3.9  mm

 Est. FW:    9191  gm      2 lb 4 oz     22  %     FW Discordancy      0 \ 4 %
Gestational Age (Fetus B)

 LMP:           27w 3d        Date:  06/18/19                 EDD:   03/24/20
 U/S Today:     27w 0d                                        EDD:   03/27/20
 Best:          27w 3d     Det. By:  LMP  (06/18/19)          EDD:   03/24/20
Anatomy (Fetus B)

 Cranium:               Appears normal         Aortic Arch:            Previously seen
 Cavum:                 Previously seen        Ductal Arch:            Not well visualized
 Ventricles:            Appears normal         Diaphragm:              Appears normal
 Choroid Plexus:        Previously seen        Stomach:                Appears normal, left
                                                                       sided
 Cerebellum:            Previously seen        Abdomen:                Previously seen
 Posterior Fossa:       Previously seen        Abdominal Wall:         Previously seen
 Nuchal Fold:           Previously seen        Cord Vessels:           Previously seen
 Face:                  Orbits nl previously.  Kidneys:                Appear normal
                        Profile WNL
 Lips:                  Previously seen        Bladder:                Appears normal
 Thoracic:              Appears normal         Spine:                  Limited views
                                                                       appear normal
 Heart:                 Appears normal         Upper Extremities:      Previously seen
                        (4CH, axis, and
                        situs)
 RVOT:                  Previously seen        Lower Extremities:      Previously seen
 LVOT:                  Previously seen

 Other:  Female gender previously seen. Heels appear normal. Technically
         difficult due to maternal habitus and fetal position.
Cervix Uterus Adnexa
 Cervix
 Not visualized (advanced GA >76wks)

 Right Ovary
 Previously seen

 Left Ovary
 Previously seen.
Impression

 Follow up growth for diamniotic dichorionic twin pregnancy
 Normal interval growth with measurements consistent with
 dates. Twin discordance is at the 4th%.
 Good fetal movement and amniotic fluid observed in both
 twins today.
Recommendations

 Follow up growth in 4 weeks.

## 2023-09-07 ENCOUNTER — Ambulatory Visit: Admitting: Family

## 2023-09-07 NOTE — Progress Notes (Deleted)
   New Patient Office Visit  Subjective:  Patient ID: Alexis Ali, female    DOB: 10/07/1985  Age: 38 y.o. MRN: [id number]  CC: No chief complaint on file.   HPI Alexis Ali presents for establishing care today.  Assessment & Plan:  There are no diagnoses linked to this encounter.  Subjective:     Outpatient Medications Prior to Visit  Medication Sig Dispense Refill   ferrous sulfate  (FERROUSUL) 325 (65 FE) MG tablet Take 1 tablet (325 mg total) by mouth 2 (two) times daily with a meal. 60 tablet 1   ibuprofen  (ADVIL ) 800 MG tablet Take 1 tablet (800 mg total) by mouth every 6 (six) hours. 30 tablet 0   lidocaine  (XYLOCAINE ) 2 % solution Use as directed 15 mLs in the mouth or throat as needed for mouth pain. 100 mL 0   Prenatal Vit-Fe Fumarate-FA (PREPLUS) 27-1 MG TABS Take 1 tablet by mouth daily. 30 tablet 13   No facility-administered medications prior to visit.   Past Medical History:  Diagnosis Date   Depression    Gestational diabetes    History of gestational diabetes mellitus (GDM) 10/03/2018   Diagnosed at 14 weeks, not GDM, but Type II DM  Current Diabetic Medications:  None  [X]  Aspirin  81 mg daily after 12 weeks (? A2/B GDM)  For A2/B GDM or higher classes of DM [X]  Diabetes Education and Testing Supplies [ ]  Nutrition Counsult  [ ]  Fetal ECHO after 20 weeks  [ ]  Eye exam for retina evaluation   Baseline and surveillance labs (pulled in from EPIC, refresh links as needed)  No results   Vaginal Pap smear, abnormal    Past Surgical History:  Procedure Laterality Date   CESAREAN SECTION MULTI-GESTATIONAL WITH TUBAL N/A 02/08/2020   Procedure: CESAREAN SECTION MULTI-GESTATIONAL WITH TUBAL;  Surgeon: Julianne Octave, MD;  Location: [facility];  Service: Obstetrics;  Laterality: N/A;   DILATION AND CURETTAGE OF UTERUS     DILATION AND EVACUATION N/A 11/16/2018   Procedure: DILATATION AND EVACUATION;  Surgeon: Julianne Octave, MD;  Location: [facility];   Service: Gynecology;  Laterality: N/A;   OPERATIVE ULTRASOUND N/A 11/16/2018   Procedure: OPERATIVE ULTRASOUND;  Surgeon: Julianne Octave, MD;  Location: [facility];  Service: Gynecology;  Laterality: N/A;    Objective:   Today's Vitals: There were no vitals taken for this visit.  Physical Exam Vitals and nursing note reviewed.  Constitutional:      Appearance: Normal appearance.  Cardiovascular:     Rate and Rhythm: Normal rate and regular rhythm.  Pulmonary:     Effort: Pulmonary effort is normal.     Breath sounds: Normal breath sounds.  Musculoskeletal:        General: Normal range of motion.  Skin:    General: Skin is warm and dry.  Neurological:     Mental Status: She is alert.  Psychiatric:        Mood and Affect: Mood normal.        Behavior: Behavior normal.     No orders of the defined types were placed in this encounter.   Versa Gore, NP

## 2023-10-21 ENCOUNTER — Ambulatory Visit: Admitting: Family

## 2024-03-09 ENCOUNTER — Encounter: Admitting: Obstetrics and Gynecology

## 2024-04-04 ENCOUNTER — Telehealth: Payer: Self-pay

## 2024-05-02 ENCOUNTER — Other Ambulatory Visit (HOSPITAL_COMMUNITY)
Admission: RE | Admit: 2024-05-02 | Discharge: 2024-05-02 | Disposition: A | Source: Ambulatory Visit | Attending: Obstetrics and Gynecology | Admitting: Obstetrics and Gynecology

## 2024-05-02 ENCOUNTER — Encounter: Payer: Self-pay | Admitting: Obstetrics and Gynecology

## 2024-05-02 ENCOUNTER — Ambulatory Visit (INDEPENDENT_AMBULATORY_CARE_PROVIDER_SITE_OTHER): Admitting: Obstetrics and Gynecology

## 2024-05-02 VITALS — BP 135/83 | HR 54 | Ht 61.0 in | Wt 208.5 lb

## 2024-05-02 DIAGNOSIS — Z113 Encounter for screening for infections with a predominantly sexual mode of transmission: Secondary | ICD-10-CM | POA: Diagnosis present

## 2024-05-02 DIAGNOSIS — N946 Dysmenorrhea, unspecified: Secondary | ICD-10-CM

## 2024-05-02 DIAGNOSIS — Z01419 Encounter for gynecological examination (general) (routine) without abnormal findings: Secondary | ICD-10-CM | POA: Insufficient documentation

## 2024-05-02 DIAGNOSIS — Z9851 Tubal ligation status: Secondary | ICD-10-CM

## 2024-05-02 MED ORDER — IBUPROFEN 600 MG PO TABS: 600.0000 mg | ORAL_TABLET | Freq: Four times a day (QID) | ORAL | 3 refills | Status: AC | PRN

## 2024-05-02 NOTE — Progress Notes (Signed)
 Pt presents for annual. Pt started her period on yesterday. Pt states that her period is heavy and would like to wait to get her pap. Pt has no questions or concerns at this time. Pt GAD-7 was 11 and PHQ-9 was 7. Pt would like a referral to talk to [facility].

## 2024-05-02 NOTE — Progress Notes (Signed)
 "   GYNECOLOGY ANNUAL PREVENTATIVE CARE ENCOUNTER NOTE  History:     Alexis Ali is a 39 y.o. [phone] female here for a routine annual gynecologic exam.  Current complaints: occasional painful menses.   Denies abnormal vaginal bleeding, discharge, problems with intercourse or other gynecologic concerns.    Gynecologic History Patient's last menstrual period was 05/01/2024 (exact date). Contraception: tubal ligation Last Pap: 6/20. Results were: normal with negative HPV Last mammogram: n/a  Obstetric History OB History  Gravida Para Term Preterm AB Living  3 2 1 1 1 3   SAB IAB Ectopic Multiple Live Births  1   1 3     # Outcome Date GA Lbr Len/2nd Weight Sex Type Anes PTL Lv  3A Preterm 02/08/20 [redacted]w[redacted]d  4 lb 2 oz (1.87 kg) M CS-LTranv Spinal  LIV     Birth Comments: preterm  3B Preterm 02/08/20 [redacted]w[redacted]d  4 lb 5.1 oz (1.96 kg) F CS-LTranv Spinal  LIV  2 SAB 11/14/18 [redacted]w[redacted]d         1 Term 06/19/06 [redacted]w[redacted]d   M Vag-Spont   LIV    Past Medical History:  Diagnosis Date   Depression    Gestational diabetes    History of gestational diabetes mellitus (GDM) 10/03/2018   Diagnosed at 14 weeks, not GDM, but Type II DM  Current Diabetic Medications:  None  [X]  Aspirin  81 mg daily after 12 weeks (? A2/B GDM)  For A2/B GDM or higher classes of DM [X]  Diabetes Education and Testing Supplies [ ]  Nutrition Counsult  [ ]  Fetal ECHO after 20 weeks  [ ]  Eye exam for retina evaluation   Baseline and surveillance labs (pulled in from EPIC, refresh links as needed)  No results   Vaginal Pap smear, abnormal     Past Surgical History:  Procedure Laterality Date   CESAREAN SECTION MULTI-GESTATIONAL WITH TUBAL N/A 02/08/2020   Procedure: CESAREAN SECTION MULTI-GESTATIONAL WITH TUBAL;  Surgeon: Herchel Gloris LABOR, MD;  Location: [facility];  Service: Obstetrics;  Laterality: N/A;   DILATION AND CURETTAGE OF UTERUS     DILATION AND EVACUATION N/A 11/16/2018   Procedure: DILATATION AND EVACUATION;  Surgeon:  Herchel Gloris LABOR, MD;  Location: [facility];  Service: Gynecology;  Laterality: N/A;   OPERATIVE ULTRASOUND N/A 11/16/2018   Procedure: OPERATIVE ULTRASOUND;  Surgeon: Herchel Gloris LABOR, MD;  Location: [facility];  Service: Gynecology;  Laterality: N/A;    Medications Ordered Prior to Encounter[1]  Allergies[2]  Social History:  reports that she quit smoking about 4 years ago. Her smoking use included cigars. She has never used smokeless tobacco. She reports that she does not currently use alcohol. She reports that she does not use drugs.  Family History  Problem Relation Age of Onset   Diabetes Mother    Hypertension Mother    Kidney disease Mother    Stroke Mother    Arthritis Maternal Grandmother     The following portions of the patient's history were reviewed and updated as appropriate: allergies, current medications, past family history, past medical history, past social history, past surgical history and problem list.  Review of Systems Pertinent items noted in HPI and remainder of comprehensive ROS otherwise negative.  Physical Exam:  BP 135/83   Pulse (!) 54   Ht 5' 1 (1.549 m)   Wt 208 lb 8 oz (94.6 kg)   LMP 05/01/2024 (Exact Date)   BMI 39.40 kg/m  CONSTITUTIONAL: Well-developed, obese,  well-nourished female in no acute distress.  HENT:  Normocephalic, atraumatic, External right and left ear normal. Oropharynx is clear and moist EYES: Conjunctivae and EOM are normal.  NECK: Normal range of motion, supple, no masses.  Normal thyroid.  SKIN: Skin is warm and dry. No rash noted. Not diaphoretic. No erythema. No pallor. MUSCULOSKELETAL: Normal range of motion. No tenderness.  No cyanosis, clubbing, or edema.  2+ distal pulses. NEUROLOGIC: Alert and oriented to person, place, and time. Normal reflexes, muscle tone coordination.  PSYCHIATRIC: Normal mood and affect. Normal behavior. Normal judgment and thought content. CARDIOVASCULAR:  Normal heart rate noted, regular rhythm RESPIRATORY: Clear to auscultation bilaterally. Effort and breath sounds normal, no problems with respiration noted. BREASTS: Symmetric in size. No masses, tenderness, skin changes, nipple drainage, or lymphadenopathy bilaterally. Performed in the presence of a chaperone. ABDOMEN: Soft, no distention noted.  No tenderness, rebound or guarding.  PELVIC: Normal appearing external genitalia and urethral meatus; normal appearing vaginal mucosa and cervix.  No abnormal discharge noted.  Pap smear obtained. Vaginal swab obtained. Normal uterine size, no other palpable masses, no uterine or adnexal tenderness.  Performed in the presence of a chaperone.   Assessment and Plan:    1. Women's annual routine gynecological examination (Primary) Normal annual exam  - Ambulatory referral to Integrated Behavioral Health - Cytology - PAP  2. History of bilateral tubal ligation   3. Dysmenorrhea Trial of NSAID to address menstrual discomfort.  - ibuprofen  (ADVIL ) 600 MG tablet; Take 1 tablet (600 mg total) by mouth every 6 (six) hours as needed.  Dispense: 60 tablet; Refill: 3  4. Routine screening for STI (sexually transmitted infection) Per pt request - Cervicovaginal ancillary only  Will follow up results of pap smear and manage accordingly. Routine preventative health maintenance measures emphasized. Please refer to After Visit Summary for other counseling recommendations.     F/u in 1 year or prn Jerilynn Buddle, MD, FACOG Obstetrician & Gynecologist, [facility] for [facility], [facility]      [1]  No current outpatient medications on file prior to visit.   No current facility-administered medications on file prior to visit.  [2] No Known Allergies  "

## 2024-05-03 ENCOUNTER — Encounter: Admitting: Obstetrics and Gynecology

## 2024-05-04 ENCOUNTER — Encounter: Payer: Self-pay | Admitting: Obstetrics and Gynecology

## 2024-05-04 LAB — CERVICOVAGINAL ANCILLARY ONLY
Bacterial Vaginitis (gardnerella): POSITIVE — AB
Candida Glabrata: NEGATIVE
Candida Vaginitis: NEGATIVE
Chlamydia: POSITIVE — AB
Comment: NEGATIVE
Comment: NEGATIVE
Comment: NEGATIVE
Comment: NEGATIVE
Comment: NEGATIVE
Comment: NORMAL
Neisseria Gonorrhea: NEGATIVE
Trichomonas: NEGATIVE

## 2024-05-05 ENCOUNTER — Encounter: Admitting: Obstetrics and Gynecology

## 2024-05-05 ENCOUNTER — Encounter: Payer: Self-pay | Admitting: Licensed Clinical Social Worker

## 2024-05-05 ENCOUNTER — Other Ambulatory Visit: Payer: Self-pay

## 2024-05-05 ENCOUNTER — Ambulatory Visit: Payer: Self-pay | Admitting: Obstetrics and Gynecology

## 2024-05-05 MED ORDER — DOXYCYCLINE HYCLATE 100 MG PO CAPS: 100.0000 mg | ORAL_CAPSULE | Freq: Two times a day (BID) | ORAL | 0 refills | Status: AC

## 2024-05-05 MED ORDER — METRONIDAZOLE 500 MG PO TABS: 500.0000 mg | ORAL_TABLET | Freq: Two times a day (BID) | ORAL | 0 refills | Status: AC

## 2024-05-08 LAB — CYTOLOGY - PAP
Chlamydia: POSITIVE — AB
Comment: NEGATIVE
Comment: NEGATIVE
Comment: NORMAL
Diagnosis: NEGATIVE
High risk HPV: NEGATIVE
Neisseria Gonorrhea: NEGATIVE

## 2024-05-09 ENCOUNTER — Ambulatory Visit: Admitting: Licensed Clinical Social Worker

## 2024-05-09 DIAGNOSIS — F4321 Adjustment disorder with depressed mood: Secondary | ICD-10-CM

## 2024-05-09 NOTE — BH Specialist Note (Signed)
 "   Integrated Behavioral Health via Telemedicine Visit  05/16/2024 Alexis Ali [id number]  Number of Integrated Behavioral Health Clinician visits: 1- Initial Visit  Session Start time: 1020   Session End time: 1120  Total time in minutes: 60    Referring Provider: Dr. Zina, MD Patient/Family location: Home [facility] Provider location: Remote Office All persons participating in visit: Patient and [facility] Types of Service: Individual psychotherapy and Video visit  I connected with Alexis Ali and/or Alexis Ali's patient via  Telephone or Video Enabled Telemedicine Application  (Video is Caregility application) and verified that I am speaking with the correct person using two identifiers. Discussed confidentiality: Yes   I discussed the limitations of telemedicine and the availability of in person appointments.  Discussed there is a possibility of technology failure and discussed alternative modes of communication if that failure occurs.  I discussed that engaging in this telemedicine visit, they consent to the provision of behavioral healthcare and the services will be billed under their insurance.  Patient and/or legal guardian expressed understanding and consented to Telemedicine visit: Yes   Presenting Concerns: Patient and/or family reports the following symptoms/concerns: increased depression Duration of problem: Months; Severity of problem: moderate  Patient and/or Family's Strengths/Protective Factors: Social and Emotional competence, Concrete supports in place (healthy food, safe environments, etc.), Caregiver has knowledge of parenting & child development, and Parental Resilience  Goals Addressed: Patient will:  Reduce symptoms of: anxiety and depression   Increase knowledge and/or ability of: coping skills, healthy habits, and self-management skills   Demonstrate ability to: Increase healthy adjustment to current life circumstances and Increase adequate support systems for  patient/family  Progress towards Goals: Ongoing    Interventions: Interventions utilized:  Mindfulness or Management Consultant, Supportive Counseling, Psychoeducation and/or Health Education, Communication Skills, and Supportive Reflection Standardized Assessments completed: Not Needed    Patient and/or Family Response:Client was present for todays virtual session. 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 Client shared feeling frequently overwhelmed due to limited support and stated she often suppresses her feelings because she has few people to talk to. She processed stress related to a prior relationship that has contributed to increased overwhelm and health-related concerns. Client reported increased grief symptoms related to multiple losses, including the death of her father in 2023/05/26, the death of her mother, and a stillbirth in May 25, 2018. She shared that when overwhelmed, she typically seeks solitude to release her emotions. Client was receptive to exploring coping strategies, including requesting additional support from the childrens father during the week, journaling, listening to music, and engaging in activities outside the home.    Clinical Assessment/Diagnosis  Adjustment disorder with depressed mood    Assessment: Patient currently experiencing heightened emotional overwhelm and grief related to caregiving demands, limited support, and unresolved losses. She reports internalizing stress and managing emotions independently, contributing to feelings of isolation..   Patient may benefit from continued support of integrated behavioral health services.  Plan: Follow up with behavioral health clinician on : 05/17/24 Behavioral recommendations: Patient is encouraged to increase use of adaptive coping strategies, including scheduling regular breaks, expressing needs to supportive individuals,  and engaging in restorative activities outside the home. Continued grief processing and development of a broader support system are recommended to reduce emotional burden and prevent burnout. Referral(s): Integrated [employer] (In Clinic)  I discussed the assessment and treatment plan with the patient  and/or parent/guardian. They were provided an opportunity to ask questions and all were answered. They agreed with the plan and demonstrated an understanding of the instructions.   They were advised to call back or seek an in-person evaluation if the symptoms worsen or if the condition fails to improve as anticipated.  Cheikh Bramble LITTIE Seats, LCSWA "

## 2024-05-17 ENCOUNTER — Ambulatory Visit (INDEPENDENT_AMBULATORY_CARE_PROVIDER_SITE_OTHER): Payer: Self-pay | Admitting: Licensed Clinical Social Worker

## 2024-05-17 DIAGNOSIS — F4321 Adjustment disorder with depressed mood: Secondary | ICD-10-CM

## 2024-05-17 NOTE — BH Specialist Note (Signed)
 "   Integrated Behavioral Health via Telemedicine Visit  05/24/2024 Alexis Ali [id number]  Number of Integrated Behavioral Health Clinician visits: 2- Second Visit  Session Start time: 1115   Session End time: 1215  Total time in minutes: 60    Referring Provider: Dr. Zina, MD Patient/Family location: Home [facility] Provider location: Remote Office All persons participating in visit: Patient and [facility] Types of Service: Individual psychotherapy and Video visit  I connected with Alexis Ali and/or Alexis Ali's patient via  Telephone or Video Enabled Telemedicine Application  (Video is Caregility application) and verified that I am speaking with the correct person using two identifiers. Discussed confidentiality: Yes   I discussed the limitations of telemedicine and the availability of in person appointments.  Discussed there is a possibility of technology failure and discussed alternative modes of communication if that failure occurs.  I discussed that engaging in this telemedicine visit, they consent to the provision of behavioral healthcare and the services will be billed under their insurance.  Patient and/or legal guardian expressed understanding and consented to Telemedicine visit: Yes   Presenting Concerns: Patient and/or family reports the following symptoms/concerns: depressive symptoms.  Duration of problem: Months; Severity of problem: moderate  Patient and/or Family's Strengths/Protective Factors: Social and Emotional competence, Concrete supports in place (healthy food, safe environments, etc.), Physical Health (exercise, healthy diet, medication compliance, etc.), and Caregiver has knowledge of parenting & child development  Goals Addressed: Patient will:  Reduce symptoms of: anxiety and depression   Increase knowledge and/or ability of: coping skills, healthy habits, and self-management skills   Demonstrate ability to: Increase healthy adjustment to current life  circumstances and Increase adequate support systems for patient/family  Progress towards Goals: Ongoing    Interventions: Interventions utilized:  Mindfulness or Management Consultant, Supportive Counseling, Psychoeducation and/or Health Education, Communication Skills, and Supportive Reflection Standardized Assessments completed: Not Needed    Patient and/or Family Response: Patient was present for todays virtual session and reported emotional distress related to conflict with her childrens father and his girlfriend regarding allegations about the childrens appearance at school, which the patient reports as untrue and hurtful. She described feelings of frustration and emotional hurt associated with perceived lack of consistent co-parenting support and feeling solely responsible for the childrens care. Patient reports continued use of coping strategies and healthy habits, including organizing her home, cleaning, and completing laundry, which she finds helpful in reducing depressive symptoms.   Clinical Assessment/Diagnosis  Adjustment disorder with depressed mood    Assessment: Patient currently experiencing emotional distress and feelings of hurt and frustration related to co-parenting conflict and perceived lack of support from the childrens father. She reports mild depressive symptoms that are being managed through active use of coping skills and household organization.  Patient may benefit from continued support of integrated behavioral health services.  Plan: Follow up with behavioral health clinician on : 05/26/2024 Behavioral recommendations: include encouraging the patient to continue utilizing coping strategies and daily routines that help reduce depressive symptoms. She is also encouraged to set boundaries within co-parenting interactions and seek additional support or respite when possible to prevent emotional burnout. Referral(s): Integrated [employer] (In  Clinic)  I discussed the assessment and treatment plan with the patient and/or parent/guardian. They were provided an opportunity to ask questions and all were answered. They agreed with the plan and demonstrated an understanding of the instructions.   They were advised to call back or seek an in-person evaluation if the symptoms  worsen or if the condition fails to improve as anticipated.  Alexis Ali, LCSWA "

## 2024-05-26 ENCOUNTER — Encounter: Payer: Self-pay | Admitting: Licensed Clinical Social Worker
